# Patient Record
Sex: Female | Born: 1935 | Race: White | Hispanic: No | Smoking: Never smoker
Health system: Southern US, Community
[De-identification: ages and names within clinical notes are randomized; demographics above are authoritative.]

## PROBLEM LIST (undated history)

## (undated) DIAGNOSIS — K449 Diaphragmatic hernia without obstruction or gangrene: Secondary | ICD-10-CM

## (undated) DIAGNOSIS — F419 Anxiety disorder, unspecified: Secondary | ICD-10-CM

## (undated) DIAGNOSIS — M5137 Other intervertebral disc degeneration, lumbosacral region: Secondary | ICD-10-CM

## (undated) DIAGNOSIS — F32A Depression, unspecified: Secondary | ICD-10-CM

## (undated) DIAGNOSIS — E785 Hyperlipidemia, unspecified: Secondary | ICD-10-CM

## (undated) DIAGNOSIS — K219 Gastro-esophageal reflux disease without esophagitis: Secondary | ICD-10-CM

## (undated) DIAGNOSIS — G8929 Other chronic pain: Secondary | ICD-10-CM

## (undated) DIAGNOSIS — I2510 Atherosclerotic heart disease of native coronary artery without angina pectoris: Secondary | ICD-10-CM

## (undated) DIAGNOSIS — I10 Essential (primary) hypertension: Secondary | ICD-10-CM

## (undated) DIAGNOSIS — M4800 Spinal stenosis, site unspecified: Secondary | ICD-10-CM

## (undated) DIAGNOSIS — S42209A Unspecified fracture of upper end of unspecified humerus, initial encounter for closed fracture: Secondary | ICD-10-CM

## (undated) DIAGNOSIS — F329 Major depressive disorder, single episode, unspecified: Secondary | ICD-10-CM

## (undated) DIAGNOSIS — F0390 Unspecified dementia without behavioral disturbance: Secondary | ICD-10-CM

## (undated) HISTORY — PX: NASAL SINUS SURGERY: SHX719

## (undated) HISTORY — DX: Hyperlipidemia, unspecified: E78.5

## (undated) HISTORY — DX: Diaphragmatic hernia without obstruction or gangrene: K44.9

## (undated) HISTORY — PX: HEMORRHOID SURGERY: SHX153

## (undated) HISTORY — DX: Atherosclerotic heart disease of native coronary artery without angina pectoris: I25.10

## (undated) HISTORY — DX: Major depressive disorder, single episode, unspecified: F32.9

## (undated) HISTORY — PX: TOTAL ABDOMINAL HYSTERECTOMY: SHX209

## (undated) HISTORY — DX: Essential (primary) hypertension: I10

## (undated) HISTORY — PX: OTHER SURGICAL HISTORY: SHX169

## (undated) HISTORY — PX: BACK SURGERY: SHX140

## (undated) HISTORY — PX: TONSILECTOMY, ADENOIDECTOMY, BILATERAL MYRINGOTOMY AND TUBES: SHX2538

## (undated) HISTORY — DX: Depression, unspecified: F32.A

## (undated) HISTORY — PX: CHOLECYSTECTOMY: SHX55

## (undated) HISTORY — DX: Anxiety disorder, unspecified: F41.9

## (undated) HISTORY — DX: Gastro-esophageal reflux disease without esophagitis: K21.9

---

## 2001-10-23 ENCOUNTER — Inpatient Hospital Stay (HOSPITAL_COMMUNITY): Admission: EM | Admit: 2001-10-23 | Discharge: 2001-10-28 | Payer: Self-pay | Admitting: *Deleted

## 2001-10-24 ENCOUNTER — Encounter: Payer: Self-pay | Admitting: *Deleted

## 2001-10-24 ENCOUNTER — Encounter: Payer: Self-pay | Admitting: Gastroenterology

## 2002-08-12 ENCOUNTER — Encounter: Payer: Self-pay | Admitting: Orthopaedic Surgery

## 2002-08-12 ENCOUNTER — Ambulatory Visit (HOSPITAL_COMMUNITY): Admission: RE | Admit: 2002-08-12 | Discharge: 2002-08-12 | Payer: Self-pay | Admitting: Orthopaedic Surgery

## 2004-08-07 ENCOUNTER — Ambulatory Visit: Payer: Self-pay | Admitting: Cardiology

## 2005-03-04 ENCOUNTER — Ambulatory Visit: Payer: Self-pay | Admitting: Cardiology

## 2006-01-03 ENCOUNTER — Ambulatory Visit: Payer: Self-pay | Admitting: Cardiology

## 2006-01-10 ENCOUNTER — Ambulatory Visit: Payer: Self-pay | Admitting: Cardiology

## 2006-08-27 ENCOUNTER — Ambulatory Visit: Payer: Self-pay | Admitting: Cardiology

## 2006-11-27 ENCOUNTER — Ambulatory Visit: Payer: Self-pay | Admitting: Orthopedic Surgery

## 2007-03-25 ENCOUNTER — Ambulatory Visit: Payer: Self-pay | Admitting: Orthopedic Surgery

## 2007-03-26 ENCOUNTER — Ambulatory Visit: Payer: Self-pay | Admitting: Cardiology

## 2007-10-09 ENCOUNTER — Ambulatory Visit: Payer: Self-pay | Admitting: Cardiology

## 2008-03-21 ENCOUNTER — Ambulatory Visit: Payer: Self-pay | Admitting: Cardiology

## 2008-09-05 ENCOUNTER — Encounter: Payer: Self-pay | Admitting: Cardiology

## 2008-11-07 ENCOUNTER — Ambulatory Visit: Payer: Self-pay | Admitting: Cardiology

## 2008-11-16 ENCOUNTER — Encounter: Payer: Self-pay | Admitting: Cardiology

## 2008-11-16 ENCOUNTER — Ambulatory Visit: Payer: Self-pay | Admitting: Cardiology

## 2009-01-13 DIAGNOSIS — E785 Hyperlipidemia, unspecified: Secondary | ICD-10-CM

## 2009-05-03 ENCOUNTER — Ambulatory Visit: Payer: Self-pay | Admitting: Cardiology

## 2009-05-03 DIAGNOSIS — I10 Essential (primary) hypertension: Secondary | ICD-10-CM

## 2009-05-03 DIAGNOSIS — I2510 Atherosclerotic heart disease of native coronary artery without angina pectoris: Secondary | ICD-10-CM | POA: Insufficient documentation

## 2009-09-01 ENCOUNTER — Encounter: Payer: Self-pay | Admitting: Cardiology

## 2009-10-26 ENCOUNTER — Ambulatory Visit: Payer: Self-pay | Admitting: Cardiology

## 2009-12-20 ENCOUNTER — Ambulatory Visit: Payer: Self-pay | Admitting: Orthopedic Surgery

## 2009-12-20 DIAGNOSIS — M4120 Other idiopathic scoliosis, site unspecified: Secondary | ICD-10-CM | POA: Insufficient documentation

## 2009-12-20 DIAGNOSIS — M5137 Other intervertebral disc degeneration, lumbosacral region: Secondary | ICD-10-CM

## 2009-12-20 DIAGNOSIS — M4800 Spinal stenosis, site unspecified: Secondary | ICD-10-CM

## 2009-12-20 HISTORY — DX: Spinal stenosis, site unspecified: M48.00

## 2009-12-20 HISTORY — DX: Other intervertebral disc degeneration, lumbosacral region: M51.37

## 2009-12-21 ENCOUNTER — Encounter (INDEPENDENT_AMBULATORY_CARE_PROVIDER_SITE_OTHER): Payer: Self-pay | Admitting: *Deleted

## 2009-12-22 ENCOUNTER — Telehealth: Payer: Self-pay | Admitting: Orthopedic Surgery

## 2010-01-02 ENCOUNTER — Telehealth: Payer: Self-pay | Admitting: Orthopedic Surgery

## 2010-03-06 ENCOUNTER — Telehealth: Payer: Self-pay | Admitting: Orthopedic Surgery

## 2010-03-17 ENCOUNTER — Encounter: Payer: Self-pay | Admitting: Orthopedic Surgery

## 2010-03-17 ENCOUNTER — Emergency Department (HOSPITAL_COMMUNITY): Admission: EM | Admit: 2010-03-17 | Discharge: 2010-03-17 | Payer: Self-pay | Admitting: Emergency Medicine

## 2010-03-20 ENCOUNTER — Encounter: Payer: Self-pay | Admitting: Orthopedic Surgery

## 2010-03-26 ENCOUNTER — Ambulatory Visit: Payer: Self-pay | Admitting: Orthopedic Surgery

## 2010-03-26 DIAGNOSIS — S42209A Unspecified fracture of upper end of unspecified humerus, initial encounter for closed fracture: Secondary | ICD-10-CM | POA: Insufficient documentation

## 2010-03-26 HISTORY — DX: Unspecified fracture of upper end of unspecified humerus, initial encounter for closed fracture: S42.209A

## 2010-03-27 ENCOUNTER — Ambulatory Visit (HOSPITAL_COMMUNITY): Admission: RE | Admit: 2010-03-27 | Payer: Self-pay | Admitting: Neurosurgery

## 2010-03-28 ENCOUNTER — Encounter: Payer: Self-pay | Admitting: Orthopedic Surgery

## 2010-04-10 ENCOUNTER — Ambulatory Visit: Payer: Self-pay | Admitting: Orthopedic Surgery

## 2010-04-18 ENCOUNTER — Encounter
Admission: RE | Admit: 2010-04-18 | Discharge: 2010-05-17 | Payer: Self-pay | Source: Home / Self Care | Attending: Orthopedic Surgery | Admitting: Orthopedic Surgery

## 2010-04-25 ENCOUNTER — Telehealth: Payer: Self-pay | Admitting: Orthopedic Surgery

## 2010-04-26 ENCOUNTER — Encounter: Payer: Self-pay | Admitting: Orthopedic Surgery

## 2010-05-20 ENCOUNTER — Encounter
Admission: RE | Admit: 2010-05-20 | Discharge: 2010-06-19 | Payer: Self-pay | Source: Home / Self Care | Attending: Orthopedic Surgery | Admitting: Orthopedic Surgery

## 2010-05-24 ENCOUNTER — Ambulatory Visit
Admission: RE | Admit: 2010-05-24 | Discharge: 2010-05-24 | Payer: Self-pay | Source: Home / Self Care | Attending: Orthopedic Surgery | Admitting: Orthopedic Surgery

## 2010-05-24 ENCOUNTER — Encounter: Payer: Self-pay | Admitting: Orthopedic Surgery

## 2010-06-09 ENCOUNTER — Encounter: Payer: Self-pay | Admitting: Neurosurgery

## 2010-06-19 NOTE — Consult Note (Signed)
Summary: Consult note from Dr.Mark Channing Mutters  Consult note from Dr.Mark Channing Mutters   Imported By: Jacklynn Ganong 03/20/2010 11:43:09  _____________________________________________________________________  External Attachment:    Type:   Image     Comment:   External Document

## 2010-06-19 NOTE — Miscellaneous (Signed)
Summary: PT initial evaluation  PT initial evaluation   Imported By: Jacklynn Ganong 04/27/2010 08:58:22  _____________________________________________________________________  External Attachment:    Type:   Image     Comment:   External Document

## 2010-06-19 NOTE — Letter (Signed)
Summary: History form  History form   Imported By: Jacklynn Ganong 03/28/2010 11:46:42  _____________________________________________________________________  External Attachment:    Type:   Image     Comment:   External Document

## 2010-06-19 NOTE — Assessment & Plan Note (Signed)
Summary: ap er 110/29/11 fx left humerus/mcr/bcbs/bsf   Visit Type:  new problem Referring Provider:  [facility] ER Primary Provider:  Dr. Kathlee Nations  CC:  left humerus fracture.  History of Present Illness: I saw Kelsey Luna in the office today for an initial visit.  She is a 77 years old woman with the complaint of:  left humerus fracture.  DOI 03-17-10. She fell on her left side.  Xrays at [facility] on 03-17-10 show left humeral neck fracture.  She was given Hydrocodone 5 by the ER and they help.  The patient fell and fractured her LEFT humerus. The initial films were of the humerus and not of the shoulder, we did have to take a lateral x-ray to confirm the nondisplaced fracture.  She is in a shoulder immobilizer.  She does complain of pain over the LEFT proximal humerus.  Allergies: 1)  ! Sudafed  Review of Systems Constitutional:  Denies weight loss, weight gain, fever, chills, and fatigue. Cardiovascular:  Denies chest pain, palpitations, fainting, and murmurs. Respiratory:  Denies short of breath, wheezing, couch, tightness, pain on inspiration, and snoring . Gastrointestinal:  Complains of nausea; denies heartburn, vomiting, diarrhea, constipation, and blood in your stools. Genitourinary:  Denies frequency, urgency, difficulty urinating, painful urination, flank pain, and bleeding in urine. Neurologic:  Complains of unsteady gait; denies numbness, tingling, dizziness, tremors, and seizure. Musculoskeletal:  Denies joint pain, swelling, instability, stiffness, redness, heat, and muscle pain. Endocrine:  Denies excessive thirst, exessive urination, and heat or cold intolerance. Psychiatric:  Complains of depression; denies nervousness, anxiety, and hallucinations. Skin:  Denies changes in the skin, poor healing, rash, itching, and redness. HEENT:  Denies blurred or double vision, eye pain, redness, and watering. Immunology:  Denies seasonal allergies, sinus problems, and  allergic to bee stings. Hemoatologic:  Denies easy bleeding and brusing.  Physical Exam  Skin:  intact without lesions or rashes Cervical Nodes:  no significant adenopathy Psych:  alert and cooperative; normal mood and affect; normal attention span and concentration   Shoulder/Elbow Exam  General:    Well-developed, well-nourished, normal body habitus; no deformities, normal grooming.    Skin:    she does have bruising and ecchymosis in the proximal LEFT humerus, along the subcutaneous skin  Inspection:    and swelling noted at the proximal humerus as well  Palpation:    with tenderness at the proximal humerus  Vascular:    Radial, ulnar, brachial, and axillary pulses 2+ and symmetric; capillary refill less than 2 seconds; no evidence of ischemia, clubbing, or cyanosis.    Sensory:    Gross sensation intact in the upper extremities.    Motor:    Normal strength in the upper extremities.    Reflexes:    defer  Shoulder Exam:    Left:    Inspection:  Abnormal    Palpation:  Abnormal    Stability:  stable    Tenderness:  left proximal humerus    Impression & Recommendations:  Problem # 1:  FRACTURE, HUMERUS, PROXIMAL (ICD-812.00) Assessment New  Orders: Est. Patient Level IV (16109) Shoulder x-ray,  minimum 2 views (60454) Humeral Neck Fx (09811)  Patient Instructions: 1)  xrays left shoulder in 2 weeks  2)  continue the sling x 2 weeks  3)  hand exercises daily    Orders Added: 1)  Est. Patient Level IV [91478] 2)  Shoulder x-ray,  minimum 2 views [73030] 3)  Humeral Neck Fx [23600]

## 2010-06-19 NOTE — Assessment & Plan Note (Signed)
Summary: RE-CK/XR/LT PROXIMAL HUMERUS/2 WK/MEDICARE/BCBS/CAF   Visit Type:  Follow-up Referring Provider:  [facility] ER Primary Provider:  Dr. Kathlee Nations  CC:  left shoulder fracture.  History of Present Illness: I saw Kelsey Luna in the office today for a 2 week  followup visit.  She is a 77 years old woman   ZD:GUYQ proximal humerus fracture  Date of injury March 17, 2010  Treatment sling-and-swathe  Plan for today x-rays and possibly ordering therapy.  Medications hydrocodone 5 mg needs refill  Complains of mild discomfort and stiffness LEFT proximal humerus and shoulder  AP and lateral x-rays of the LEFT shoulder show proximal humerus fracture which meets the near criteria for nonoperative treatment  Alignment is acceptable  Impression healing LEFT proximal humerus fracture with no additional displacement  Recommended physical therapy wean from sling follow up 6 weeks    Allergies: 1)  ! Sudafed   Impression & Recommendations: refill hydrocodone 5 mg   Other Orders: Post-Op Check (03474) Shoulder x-ray,  minimum 2 views (25956)  Patient Instructions: 1)  start Phys Therapy outpatient  2)  wear sling until weaned off at Phys therapy  3)  f/u in 6 weeks    Orders Added: 1)  Post-Op Check [99024] 2)  Shoulder x-ray,  minimum 2 views [73030]

## 2010-06-19 NOTE — Medication Information (Signed)
Summary: Tax adviser   Imported By: Cammie Sickle 04/13/2010 13:56:18  _____________________________________________________________________  External Attachment:    Type:   Image     Comment:   External Document

## 2010-06-19 NOTE — Progress Notes (Signed)
Summary: Neurosurgeon appointment.  Phone Note Outgoing Call   Call placed by: Waldon Reining,  January 02, 2010 4:51 PM Call placed to: Patient Action Taken: Appt scheduled Summary of Call: I called to give the patient her appointment at [facility] with Dr. Channing Mutters on 03-06-10 at 1:00. Patient is aware of her appointment.

## 2010-06-19 NOTE — Letter (Signed)
Summary: History form  History form   Imported By: Jacklynn Ganong 12/20/2009 14:04:41  _____________________________________________________________________  External Attachment:    Type:   Image     Comment:   External Document

## 2010-06-19 NOTE — Assessment & Plan Note (Signed)
Summary: 6 MO F/U PER REMINDER-JM   Visit Type:  Follow-up Primary Provider:  Dr. Kathlee Nations   History of Present Illness: 77 year old woman presents for follow-up. She denies any problems with active angina or unusual breathlessness. She states that she had followup lab work done with Dr. Maryellen Pile a few months ago and that her cholesterol was "high." She remains off of statin medications as detailed in the prior note.  She has been having some "back trouble" but otherwise tries to stay active. She just recently cut part of her yard on a riding mower.  We discussed her blood pressure which is elevated again today. It sounds as if her sodium intake is too liberal. Sodium restriction was discussed.  She also states she has had some trouble with depression, and is now on Buproprion XL.  Preventive Screening-Counseling & Management  Alcohol-Tobacco     Smoking Status: never  Current Medications (verified): 1)  Alprazolam 1 Mg Tabs (Alprazolam) .... Take 1-2 Tablet By Mouth Once A Day At Bedtime 2)  Lisinopril 20 Mg Tabs (Lisinopril) .... Take 1 Tablet By Mouth Once A Day 3)  Caltrate 600+d 600-400 Mg-Unit Tabs (Calcium Carbonate-Vitamin D) .... Take 3 Tablet By Mouth Once A Day 4)  Metoprolol Tartrate 25 Mg Tabs (Metoprolol Tartrate) .... Take 1 Tablet By Mouth Two Times A Day 5)  Hydrochlorothiazide 25 Mg Tabs (Hydrochlorothiazide) .... Take 1 Tablet By Mouth Once A Day 6)  Omeprazole 20 Mg Cpdr (Omeprazole) .... Take 1 Tablet By Mouth Once A Day 7)  Multi Vitamin .... Take 1 Tablet By Mouth Once A Day 8)  Potassium .... Take 1 Tablet By Mouth Once A Day 9)  Aspirin 325 Mg Tabs (Aspirin) .... Take 1 Tablet By Mouth Once A Day 10)  Fish Oil 1200 Mg Caps (Omega-3 Fatty Acids) .... Take 3 Tablet By Mouth Once A Day 11)  Miralax  Powd (Polyethylene Glycol 3350) .... Take 1 Tablet By Mouth Once A Day 12)  Garlic Oil 1000 Mg Caps (Garlic) .... Take 3 Tablet By Mouth Once A Day 13)  Vitamin B-12  500 Mcg Tabs (Cyanocobalamin) .... Take 1 Tablet By Mouth Once A Day 14)  Tylenol Extra Strength 500 Mg Tabs (Acetaminophen) .... Take 1 Tablet By Mouth Once A Day As Needed 15)  Budeprion Xl 300 Mg Xr24h-Tab (Bupropion Hcl) .... Take 1 Tablet By Mouth Once A Day 16)  Buspirone Hcl 5 Mg Tabs (Buspirone Hcl) .... Take 1 Tablet By Mouth Three Times A Day  Allergies (verified): 1)  ! Sudafed  Comments:  Nurse/Medical Assistant: The patient's medication list and allergies were reviewed with the patient and were updated in the Medication and Allergy Lists.  Past History:  Past Medical History: Last updated: 05/03/2009 CAD - DES LAD 6/03, LVEF 67% Hyperlipidemia Hypertension G E R D Hiatal hernia  Social History: Last updated: 05/03/2009 Widowed  Tobacco Use - No Alcohol Use - no  Review of Systems  The patient denies anorexia, fever, chest pain, syncope, dyspnea on exertion, peripheral edema, prolonged cough, melena, and hematochezia.         Otherwise reviewed and negative except as outlined.  Vital Signs:  Patient profile:   77 year old female Height:      63 inches Weight:      141 pounds Pulse rate:   54 / minute BP sitting:   156 / 88  (left arm) Cuff size:   regular  Vitals Entered By: Isabelle Course  Anderson (October 26, 2009 12:46 PM)  Physical Exam  Additional Exam:  Normally nourished appearing woman in no acute distress. HEENT: Conjunctiva and lids normal, oropharynx clear. Neck: Supple, no carotid bruits, no thyromegaly. Lungs: Clear without labored breathing. Cardiac: Regular rate and rhythm, no S3 gallop. Abdomen: Soft, no hepatomegaly, bowel sounds present. Extremities: No pitting edema, distal pulses 2+. Skin: Warm and dry. Musculoskeletal: No kyphosis. Neuropsychiatric: Alert and oriented x3, affect appropriate.   Nuclear Study  Procedure date:  11/16/2008  Findings:      Lexiscan cardiolite without diagnostic ST changes, no chest pain, and LVEF 75%.   Small, fixed, apical defect consistent with soft tissue attenuation.  EKG  Procedure date:  10/26/2009  Findings:      Sinus bradycardia at 56 beats per minute with nonspecific T wave changes.  Impression & Recommendations:  Problem # 1:  CORONARY ATHEROSCLEROSIS NATIVE CORONARY ARTERY (ICD-414.01)  Symptomatically stable without active angina on present medical therapy. Electrocardiogram is also stable. Followup be arranged for 6 months.  Her updated medication list for this problem includes:    Lisinopril 20 Mg Tabs (Lisinopril) .Marland Kitchen... Take 1 tablet by mouth once a day    Metoprolol Tartrate 25 Mg Tabs (Metoprolol tartrate) .Marland Kitchen... Take 1 tablet by mouth two times a day    Aspirin 325 Mg Tabs (Aspirin) .Marland Kitchen... Take 1 tablet by mouth once a day  Orders: EKG w/ Interpretation (93000)  Problem # 2:  ESSENTIAL HYPERTENSION, BENIGN (ICD-401.1)  Blood pressure is not well controlled. I discussed this with her again, including sodium restriction. I suspect she will ultimately require further medication titration, potentially an increase in her lisinopril.  Her updated medication list for this problem includes:    Lisinopril 20 Mg Tabs (Lisinopril) .Marland Kitchen... Take 1 tablet by mouth once a day    Metoprolol Tartrate 25 Mg Tabs (Metoprolol tartrate) .Marland Kitchen... Take 1 tablet by mouth two times a day    Hydrochlorothiazide 25 Mg Tabs (Hydrochlorothiazide) .Marland Kitchen... Take 1 tablet by mouth once a day    Aspirin 325 Mg Tabs (Aspirin) .Marland Kitchen... Take 1 tablet by mouth once a day  Problem # 3:  HYPERLIPIDEMIA-MIXED (ICD-272.4)  Reported intolerance to Zocor and Crestor in the past, and preference to not take Vytorin related to cost. She is on no statin medication now. Will request most recent lipids from Dr. Maryellen Pile for review.  Patient Instructions: 1)  Your physician wants you to follow-up in: 6 months. You will receive a reminder letter in the mail one-two months in advance. If you don't receive a letter, please  call our office to schedule the follow-up appointment. 2)  Decrease salt intake. 3)  We will obtain labs from Dr. Jake Church office and notify you if any changes need to be made once these are reviewed.

## 2010-06-19 NOTE — Miscellaneous (Signed)
Summary: neurosurgeon referral  Clinical Lists Changes  Orders: Added new Referral order of Neurosurgeon Referral (Neurosurgeon) - Signed 

## 2010-06-19 NOTE — Progress Notes (Signed)
Summary: Neurosurgeon referral.  Phone Note Outgoing Call   Call placed by: Waldon Reining,  December 22, 2009 9:47 AM Call placed to: Specialist Action Taken: Information Sent Summary of Call: I faxed a referral for this patient to [facility] to be seen for her back, scoliosis and spinal stenosis.

## 2010-06-19 NOTE — Progress Notes (Signed)
Summary: took a fall yesterday  Phone Note Other Incoming   Summary of Call: Dana Allan called about her mother, Kelsey Luna (28-Jun-2026) [name] fell while in [place] yesterday.  Was taken to [facility] ER and they did x rays of shoulder, pelvis, and legs. The doctor told her he did not see any fractures and it looked like her shoulder is healing nicely. She is OK today except for being very sore in right hip and leg.  She is scheduled for therapy today, but rescheduled until Friday She is scheduled to see you 05/24/10, but wanted to know if you think she needs to come in sooner.  Please advise. Dana's # [phone]   Initial call taken by: Jacklynn Ganong,  April 25, 2010 9:10 AM  Follow-up for Phone Call        no I think appointment as scheduled is ok , if no new injury  Follow-up by: Fuller Canada MD,  April 25, 2010 9:11 AM  Additional Follow-up for Phone Call Additional follow up Details #1::        Advised of reply Additional Follow-up by: Jacklynn Ganong,  April 25, 2010 9:14 AM

## 2010-06-19 NOTE — Assessment & Plan Note (Signed)
Summary: LOW BACK/HIP PAIN NEEDS XR/BCBS/MEDICARE/BSF   Vital Signs:  Patient profile:   77 year old female Height:      63 inches Weight:      142 pounds Pulse rate:   80 / minute Resp:     16 per minute  Vitals Entered By: Fuller Canada MD (December 20, 2009 10:52 AM)  Visit Type:  new patient Referring Provider:  self Primary Provider:  Dr. Kathlee Nations  CC:  low back pain.  History of Present Illness: This is an 77 year old female who complains of lower back pain after 2 back surgeries 31 and 38 years ago somewhere in [place].  Since last fall she has noted the RIGHT hip is higher than the LEFT and she's had RIGHT hip and lower back pain which has been sharp,burning and constant.  She thinks it came on suddenly; she cannot find any relief; its worse when she stands or walks.  Her legs are weak; she does not complain of numbness.  She does complain of some catching in her back.  Her pain is 9/10.  Needs xrays today.  Meds: Bupropion, Omeprazole, Lisinopril, Buspar, Metoprolol, Simvastatin, HCTZ, Xanax.    Allergies: 1)  ! Sudafed  Past History:  Past Medical History: CAD - DES LAD 6/03, LVEF 67% Hyperlipidemia Hypertension G E R D Hiatal hernia anxiety depression  Past Surgical History: Back surgery x 2 Hemorrhoidectomy Total Abdominal Hysterectomy Tonsillectomy Sinus surgery cataracts  Family History: Father: died age 76 with CAD, cancer, diabetes mellitus Mother: died age 30 with diabetes Siblings: diabetes in sister FH of Cancer:  Family History of Diabetes Family History Coronary Heart Disease female < 46 Family History of Arthritis Hx, family, asthma  Social History: Widowed  retired Tobacco Use - No Alcohol Use - no no caffeine use 12th grade ed.  Review of Systems Constitutional:  Denies weight loss, weight gain, fever, chills, and fatigue. Cardiovascular:  Denies chest pain, palpitations, fainting, and murmurs. Respiratory:  Complains of  wheezing and couch; denies short of breath, tightness, pain on inspiration, and snoring . Gastrointestinal:  Denies heartburn, nausea, vomiting, diarrhea, constipation, and blood in your stools. Genitourinary:  Denies frequency, urgency, difficulty urinating, painful urination, flank pain, and bleeding in urine. Neurologic:  Complains of unsteady gait; denies numbness, tingling, dizziness, tremors, and seizure. Musculoskeletal:  Complains of stiffness; denies joint pain, swelling, instability, redness, heat, and muscle pain. Endocrine:  Denies excessive thirst, exessive urination, and heat or cold intolerance. Psychiatric:  Complains of depression; denies nervousness, anxiety, and hallucinations. Skin:  Denies changes in the skin, poor healing, rash, itching, and redness. HEENT:  Denies blurred or double vision, eye pain, redness, and watering. Immunology:  Complains of seasonal allergies; denies sinus problems and allergic to bee stings. Hemoatologic:  Denies easy bleeding and brusing.  Physical Exam  Additional Exam:  GEN: well developed, well nourished, normal grooming and hygiene, no deformity and endomorphicbody habitus.   CDV: Lower extremity, pulses are normal, no edema, no erythema. no tenderness  Lymph: normal lymph nodes   Skin: no rashes, skin lesions or open sores   NEURO: normal coordination, reflexes, sensation.   Psyche: awake, alert and oriented. Mood normal   Gait: she is standing with a superior RIGHT hip compared to the LEFT terms of the pelvic crest show scoliosis and her back has tenderness in the SI joints in the midline of the lumbar spine.  Straight leg raises seem normal.  Muscle strength and tone seemed normal and  lower extremities  Important her RIGHT hip and LEFT hip have normal range of motion abduction adduction flexion-extension no pain no deformity.  It measured leg lengths are equal she has a slight flexion contracture in the LEFT knee     Impression  & Recommendations:  Problem # 1:  DEGENERATIVE DISC DISEASE, LUMBOSACRAL SPINE (ICD-722.52) Assessment New  Orders: Est. Patient Level IV (16109) Pelvis x-ray, 1/2 views (72170) Lumbosacral Spine ,2/3 views (72100)  Problem # 2:  SCOLIOSIS, LUMBAR SPINE (ICD-737.30) Assessment: New  Orders: Est. Patient Level IV (60454) Pelvis x-ray, 1/2 views (72170) Lumbosacral Spine ,2/3 views (72100)  Problem # 3:  SPINAL STENOSIS (ICD-724.00) Assessment: New  x-rays lumbar spine show severe scoliosis degenerative disc disease multilevel disc space narrowing  Pelvic x-rays show mild arthritis RIGHT hip normal LEFT hip minor disease no surgery needed.  Recommend neurosurgical referral  Orders: Est. Patient Level IV (09811) Pelvis x-ray, 1/2 views (91478) Lumbosacral Spine ,2/3 views (72100)

## 2010-06-19 NOTE — Progress Notes (Signed)
Summary: patient picked up Xray films  Phone Note Call from Patient   Summary of Call: Patient and daughter came to pick up copy of Xray films from our office (were at front window.) Contact ph - Daughter cell ph #[phone], Dana Allan Initial call taken by: Cammie Sickle,  March 06, 2010 10:48 AM

## 2010-06-21 ENCOUNTER — Ambulatory Visit: Payer: Medicare Other | Attending: Orthopedic Surgery | Admitting: Physical Therapy

## 2010-06-21 DIAGNOSIS — M25519 Pain in unspecified shoulder: Secondary | ICD-10-CM | POA: Insufficient documentation

## 2010-06-21 DIAGNOSIS — R5381 Other malaise: Secondary | ICD-10-CM | POA: Insufficient documentation

## 2010-06-21 DIAGNOSIS — IMO0001 Reserved for inherently not codable concepts without codable children: Secondary | ICD-10-CM | POA: Insufficient documentation

## 2010-06-21 DIAGNOSIS — M25619 Stiffness of unspecified shoulder, not elsewhere classified: Secondary | ICD-10-CM | POA: Insufficient documentation

## 2010-06-21 NOTE — Assessment & Plan Note (Signed)
Summary: 6 WK RE-CK LT PROX HUMERUS/FOL'G PT/MEDICARE/BCBS/CAF   Visit Type:  Follow-up Referring Provider:  [facility] ER Primary Provider:  Dr. Kathlee Nations  CC:  left proximal humerus fracture.  History of Present Illness: I saw Kelsey Luna in the office today for a 6 week  followup visit.  She is a 77 years old woman with the complaint of:  left proximal humeral fracture.  UJ:[id number] proximal humerus fracture  Date of injury March 17, 2010  Treatment sling-and-swathe  Medications hydrocodone 5 mg   Physical therapy notes included in this evaluation been reviewed patient is making excellent progress and demonstrate 110 of forward elevation.    Allergies: 1)  ! Sudafed   Impression & Recommendations:  Problem # 1:  AFTERCARE HEALING TRAUMATIC FRACTURE UPPER ARM (ICD-V54.11) Assessment Comment Only  Orders: Post-Op Check (14782)  Problem # 2:  FRACTURE, HUMERUS, PROXIMAL (ICD-812.00) Assessment: Comment Only  Orders: Post-Op Check (95621)  Medications Added to Medication List This Visit: 1)  Lortab 5-500 Mg Tabs (Hydrocodone-acetaminophen) .Marland Kitchen.. 1 q 4 as needed pain  Patient Instructions: 1)  Please schedule a follow-up appointment in 1 month. Prescriptions: LORTAB 5-500 MG TABS (HYDROCODONE-ACETAMINOPHEN) 1 q 4 as needed pain  #60 x 1   Entered and Authorized by:   Fuller Canada MD   Signed by:   Fuller Canada MD on 05/24/2010   Method used:   Print then Give to Patient   RxID:   [id number]    Orders Added: 1)  Post-Op Check [41324]

## 2010-06-21 NOTE — Miscellaneous (Signed)
Summary: Progress Note LT Proximal Humerus fracture  Progress Note LT Proximal Humerus fracture   Imported By: Eugenio Hoes 05/24/2010 14:19:04  _____________________________________________________________________  External Attachment:    Type:   Image     Comment:   External Document

## 2010-06-26 ENCOUNTER — Ambulatory Visit: Payer: Medicare Other | Admitting: *Deleted

## 2010-06-28 ENCOUNTER — Ambulatory Visit: Payer: Medicare Other | Admitting: Physical Therapy

## 2010-07-02 ENCOUNTER — Ambulatory Visit: Payer: Medicare Other | Admitting: Physical Therapy

## 2010-07-03 ENCOUNTER — Ambulatory Visit (INDEPENDENT_AMBULATORY_CARE_PROVIDER_SITE_OTHER): Payer: Medicare Other | Admitting: Orthopedic Surgery

## 2010-07-03 ENCOUNTER — Encounter: Payer: Self-pay | Admitting: Orthopedic Surgery

## 2010-07-03 DIAGNOSIS — S42209A Unspecified fracture of upper end of unspecified humerus, initial encounter for closed fracture: Secondary | ICD-10-CM

## 2010-07-03 DIAGNOSIS — S42309D Unspecified fracture of shaft of humerus, unspecified arm, subsequent encounter for fracture with routine healing: Secondary | ICD-10-CM

## 2010-07-05 ENCOUNTER — Ambulatory Visit: Payer: Medicare Other | Admitting: *Deleted

## 2010-07-11 NOTE — Assessment & Plan Note (Signed)
Summary: 1 mth fol-up lt prox humerus/mcr/bcbs/wkj   Visit Type:  Follow-up Referring Provider:  [facility] Primary Provider:  Dr. Kathlee Nations  CC:  left humerus.  History of Present Illness: I saw Kelsey Luna in the office today for a 1 month followup visit.  She is a 77 years old woman with the complaint of:  left proximal humerus   EA:[id number] proximal humerus fracture  Date of injury March 17, 2010  Treatment sling-and-swathe  Medications hydrocodone 5 mg   Physical therapy notes included in this evaluation been reviewed patient is making excellent progress and demonstrate 110 of forward elevation.  Complaints: She has been falling alot because she has a catching in her back. She went to see Dr. Channing Mutters and he is sendind her for ESI injections. her shoulder is feeling better.  She has done well with therapy. She can placed and a cuff of her head without any difficulty.  We are releasing her. She'll follow up with Dr. Joylene Igo regarding her falling and spinal stenosis.  Allergies: 1)  ! Sudafed   Impression & Recommendations:  Problem # 1:  AFTERCARE HEALING TRAUMATIC FRACTURE UPPER ARM (ICD-V54.11) Assessment Improved  Orders: Est. Patient Level II (81191)  Patient Instructions: 1)  Please schedule a follow-up appointment as needed.   Orders Added: 1)  Est. Patient Level II [47829]

## 2010-07-12 ENCOUNTER — Other Ambulatory Visit: Payer: Self-pay | Admitting: Neurosurgery

## 2010-07-12 DIAGNOSIS — M47816 Spondylosis without myelopathy or radiculopathy, lumbar region: Secondary | ICD-10-CM

## 2010-07-16 ENCOUNTER — Ambulatory Visit
Admission: RE | Admit: 2010-07-16 | Discharge: 2010-07-16 | Disposition: A | Discharge status: Home / Self Care | Payer: Medicare Other | Source: Ambulatory Visit | Attending: Neurosurgery | Admitting: Neurosurgery

## 2010-07-16 DIAGNOSIS — M47816 Spondylosis without myelopathy or radiculopathy, lumbar region: Secondary | ICD-10-CM

## 2010-08-01 ENCOUNTER — Encounter: Payer: Self-pay | Admitting: *Deleted

## 2010-08-01 LAB — DIFFERENTIAL
Basophils Absolute: 0 10*3/uL (ref 0.0–0.1)
Basophils Relative: 0 % (ref 0–1)
Eosinophils Absolute: 0.1 10*3/uL (ref 0.0–0.7)
Eosinophils Relative: 1 % (ref 0–5)
Lymphocytes Relative: 22 % (ref 12–46)
Lymphs Abs: 2.1 10*3/uL (ref 0.7–4.0)
Monocytes Absolute: 0.5 10*3/uL (ref 0.1–1.0)
Monocytes Relative: 6 % (ref 3–12)
Neutro Abs: 6.8 10*3/uL (ref 1.7–7.7)
Neutrophils Relative %: 72 % (ref 43–77)

## 2010-08-01 LAB — CBC
HCT: 38.7 % (ref 36.0–46.0)
Hemoglobin: 13.3 g/dL (ref 12.0–15.0)
MCH: 31.6 pg (ref 26.0–34.0)
MCHC: 34.4 g/dL (ref 30.0–36.0)
MCV: 91.9 fL (ref 78.0–100.0)
Platelets: 342 10*3/uL (ref 150–400)
RBC: 4.21 MIL/uL (ref 3.87–5.11)
RDW: 12.8 % (ref 11.5–15.5)
WBC: 9.4 10*3/uL (ref 4.0–10.5)

## 2010-08-01 LAB — BASIC METABOLIC PANEL
BUN: 11 mg/dL (ref 6–23)
CO2: 24 mEq/L (ref 19–32)
Calcium: 10 mg/dL (ref 8.4–10.5)
Chloride: 94 mEq/L — ABNORMAL LOW (ref 96–112)
Creatinine, Ser: 0.59 mg/dL (ref 0.4–1.2)
GFR calc Af Amer: >60 mL/min (ref >60)
GFR calc non Af Amer: >60 mL/min (ref >60)
Glucose, Bld: 127 mg/dL — ABNORMAL HIGH (ref 70–99)
Potassium: 3.5 mEq/L (ref 3.5–5.1)
Sodium: 127 mEq/L — ABNORMAL LOW (ref 135–145)

## 2010-08-28 ENCOUNTER — Encounter: Payer: Self-pay | Admitting: Cardiology

## 2010-08-28 ENCOUNTER — Ambulatory Visit (INDEPENDENT_AMBULATORY_CARE_PROVIDER_SITE_OTHER): Payer: Medicare Other | Admitting: Cardiology

## 2010-08-28 VITALS — BP 135/78 | HR 55 | Ht 63.0 in | Wt 147.1 lb

## 2010-08-28 DIAGNOSIS — I2510 Atherosclerotic heart disease of native coronary artery without angina pectoris: Secondary | ICD-10-CM

## 2010-08-28 DIAGNOSIS — E785 Hyperlipidemia, unspecified: Secondary | ICD-10-CM

## 2010-08-28 DIAGNOSIS — I10 Essential (primary) hypertension: Secondary | ICD-10-CM

## 2010-08-28 NOTE — Assessment & Plan Note (Signed)
No medication changes made today. Followup with Dr. Maryellen Pile.

## 2010-08-28 NOTE — Assessment & Plan Note (Signed)
Symptomatically stable without active angina on medical therapy, fortunately in the setting of orthopedic problems over the last year. As best I can tell no surgery is planned. Most recent ischemic workup was in December 2010, outlined above. No changes made today. Encouraged reasonable activity with precautions, use of walker. Followup scheduled for 6 months.

## 2010-08-28 NOTE — Progress Notes (Signed)
Clinical Summary Kelsey Luna is a 77 y.o.female presenting for followup. She was seen in June 2011, Kelsey Luna visit related to a fall with arm fracture. She has been recuperating fairly well, also has associated spine disease followed by Kelsey Luna, using a walker and status post epidural injections. No surgery is planned based on her description.  Fortunately, she reports no syncope, no exertional chest pain. She reports compliance with her medications. She is due to see Kelsey Luna soon with followup lab work.   Allergies  Allergen Reactions  . Pseudoephedrine Other (See Comments)    Pt states she lost her memory for a day when she took Sudafed.     Current outpatient prescriptions:ALPRAZolam (XANAX) 1 MG tablet, Take 1 mg by mouth at bedtime. Take 1-2 tablets by mouth once a day at bedtime , Disp: , Rfl: ;  aspirin 325 MG tablet, Take 325 mg by mouth daily.  , Disp: , Rfl: ;  buPROPion (BUDEPRION XL) 300 MG 24 hr tablet, Take 300 mg by mouth daily.  , Disp: , Rfl: ;  BUSPIRONE HCL PO, Take 5 mg by mouth 3 (three) times daily. Take 1 tablet by mouth three times daily , Disp: , Rfl:  Calcium Carbonate-Vitamin D (CALTRATE 600+D) 600-400 MG-UNIT per tablet, Take 3 tablets by mouth daily. Take 3 tablets by mouth once a day , Disp: , Rfl: ;  Garlic Oil 1000 MG CAPS, Take 1,000 mg by mouth daily. Take 3 tablets by mouth daily   , Disp: , Rfl: ;  hydrochlorothiazide 25 MG tablet, Take 25 mg by mouth daily. Take one tablet by mouth once daily , Disp: , Rfl:  HYDROcodone-acetaminophen (LORTAB 5) 5-500 MG per tablet, Take 1 tablet by mouth every 4 (four) hours as needed. 1 q 4 as needed pain , Disp: , Rfl: ;  lisinopril (PRINIVIL,ZESTRIL) 20 MG tablet, Take 20 mg by mouth daily. Take one tablet by mouth once daily , Disp: , Rfl: ;  METOPROLOL TARTRATE PO, Take 25 mg by mouth 2 (two) times daily. Take 1 tablet by mouth two times a day   , Disp: , Rfl:  Multiple Vitamin (MULTIVITAMIN PO), Take by mouth daily.  ,  Disp: , Rfl: ;  Omega-3 Fatty Acids (FISH OIL) 1200 MG CAPS, Take by mouth daily. Take 3 tablets by mouth once a day , Disp: , Rfl: ;  omeprazole (PRILOSEC) 20 MG capsule, Take 20 mg by mouth daily.  , Disp: , Rfl: ;  polyethylene glycol (MIRALAX) powder, Take by mouth daily.  , Disp: , Rfl: ;  Potassium 99 MG TABS, Take 1 tablet by mouth daily.  , Disp: , Rfl:  acetaminophen (TYLENOL) 500 MG tablet, Take 500 mg by mouth daily. Take 1 tablet by mouth once a da as needed , Disp: , Rfl: ;  Cyanocobalamin (VITAMIN B-12 PO), Take 500 mcg by mouth daily.  , Disp: , Rfl:   Past Medical History  Diagnosis Date  . CAD (coronary artery disease)     DES LAD 6/03, LVEF 67%  . Hyperlipidemia   . Hypertension   . GERD (gastroesophageal reflux disease)   . Hiatal hernia   . Anxiety   . Depression     Social History Kelsey Luna reports that she has never smoked. She has never used smokeless tobacco. Kelsey Luna reports that she does not drink alcohol.  Review of Systems As outlined above, otherwise reviewed and negative.  Physical Examination Filed Vitals:   08/28/10  1513  BP: 135/78  Pulse: 55  Normally nourished appearing woman in no acute distress. HEENT: Conjunctiva and lids normal, oropharynx clear. Neck: Supple, no carotid bruits, no thyromegaly. Lungs: Clear without labored breathing. Cardiac: Regular rate and rhythm, no S3 gallop. Abdomen: Soft, no hepatomegaly, bowel sounds present. Extremities: No pitting edema, distal pulses 2+. Skin: Warm and dry. Musculoskeletal: No kyphosis. Neuropsychiatric: Alert and oriented x3, affect appropriate.   Studies Lexiscan Cardiolite 11/16/2008: Lexiscan cardiolite without diagnostic ST changes, no chest pain, and LVEF 75%.  Small, fixed, apical defect consistent with soft tissue attenuation.  Problem List and Plan

## 2010-08-28 NOTE — Assessment & Plan Note (Signed)
Pending followup lab work Dr. Maryellen Pile. Would aim for LDL close to 70 if possible.

## 2010-08-28 NOTE — Patient Instructions (Signed)
Your physician wants you to follow-up in: 6 months. You will receive a reminder letter in the mail one-two months in advance. If you don't receive a letter, please call our office to schedule the follow-up appointment. Your physician recommends that you continue on your current medications as directed. Please refer to the Current Medication list given to you today. 

## 2010-10-02 NOTE — Assessment & Plan Note (Signed)
[facility]                          [facility] NOTE   NAME:Kelsey Luna, Kelsey Luna                      MRN:          [id number]  DATE:10/09/2007                            DOB:          22-Aug-1926    CARDIOLOGIST:  Dr. Simona Huh.   PRIMARY CARE PHYSICIAN:  Dr. Kathlee Nations.   REASON FOR VISIT:  26-month followup.   HISTORY OF PRESENT ILLNESS:  [name] is a 77 year old female patient  with a history of CAD status post CYPHER stenting to the proximal LAD in  June 2003.  She was last in the office in November 2008.  Her last  stress test was August 2007 that revealed no ischemia.  Her ejection  fraction was preserved at 67%.   Since last being seen, the patient has been doing well.  She has been  trying to walk about 30 minutes per day.  She denies any chest heaviness  or tightness with exertion.  She denies any significant shortness of  breath with exertion.  She denies any syncope, near-syncope.  Denies any  orthopnea, PND or pedal edema.   CURRENT MEDICATIONS:  1. Omeprazole 20 mg daily.  2. Metoprolol 25 mg b.i.d.  3. Lisinopril 20 mg daily.  4. Hydrochlorothiazide 25 mg daily.  5. Enteric-coated aspirin 325 mg daily.  6. Diclofenac 50 mg 3 times a day.  7. Vytorin 20 mg daily.  8. Xanax 1 mg daily.  9. Multivitamin daily.  10.Calcium b.i.d.  11.Potassium 99 mg daily.  12.Fish oil 1200 mg b.i.d.  13.Vitamin B12 250 mg daily.  14.Darvocet p.r.n.   PHYSICAL EXAM:  She is well-developed, well-nourished female in no acute  distress.  Blood pressure is 133/60, pulse 53, weight 252.2 pounds.  HEENT is normal without JVD.  CARDIOVASCULAR:  S1, S2.  Regular rate and rhythm without murmur.  LUNGS:  Clear to auscultation bilaterally.  ABDOMEN:  Soft, nontender.  EXTREMITIES:  No edema.  NEUROLOGIC:  She is alert and oriented x3.  Cranial nerves 2-12 grossly  intact.  VASCULAR:  Without carotid bruits bilaterally.  Dorsalis pedis  and  presents pulses 2+ bilaterally.  No femoral bruits noted.   IMPRESSION:  1. Coronary disease status post CYPHER drug-eluting stent placement to      the LAD in 2003.      a.     Nonischemic Cardiolite 2007.  2. Preserved left ventricular function.  3. Hypertension.  4. Hyperlipidemia.  5. Gastroesophageal reflux disease.  6. Osteoarthritis.   PLAN:  1. The patient presents for followup.  Overall, she is doing well.      She denies any symptoms of angina.  No medication changes made      today.  No further testing is required at this point.  2. She will follow up in 6 months with Dr. Diona Browner or sooner p.r.n.      Tereso Newcomer, PA-C  Electronically Signed      [name], MD  Electronically Signed   SW/MedQ  DD: 10/09/2007  DT: 10/09/2007  Job #: [id number]   cc:   Renae Fickle  Maryellen Pile

## 2010-10-02 NOTE — Assessment & Plan Note (Signed)
[facility]                          [facility] NOTE   NAME:Kelsey Luna, Kelsey Luna                      MRN:          [id number]  DATE:11/07/2008                            DOB:          1927-03-12    PRIMARY CARE PHYSICIAN:  Dr. Kathlee Nations.   REASON FOR VISIT:  Routine followup.   HISTORY OF PRESENT ILLNESS:  Kelsey Luna was seen back in November 2009.  I was able to review lipids drawn by Dr. Maryellen Pile back in April, and LDL  was optimally controlled at 69.  Liver function tests were also normal.  Kelsey Luna states that over the last few months she has been experiencing  intermittent epigastric and chest discomfort, somewhat sporadic, and  occasionally worse after meals.  On the other hand, she mentions an  episode that occurred when she was walking a lot during a day while  shopping.  Her last ischemic evaluation was via an adenosine Cardiolite  in August 2007.  This study revealed a left ventricular ejection  fraction of 67% with a small periapical defect that was fixed and  overall low risk for significant ischemia.  Her followup  electrocardiogram today shows sinus bradycardia at 49 beats per minute  with diffuse nonspecific ST-T wave changes, not progressive when  compared to the prior tracing in November.  She also has decreased R-  wave progression which is also old.   ALLERGIES:  SUDAFED.   MEDICATIONS:  1. Omeprazole 20 mg p.o. daily.  2. Lopressor 25 mg p.o. b.i.d.  3. Lisinopril 20 mg p.o. daily.  4. Hydrochlorothiazide 25 mg p.o. daily.  5. Enteric-coated aspirin 325 mg p.o. daily.  6. Diclofenac 50 mg p.o. b.i.d. to t.i.d.  7. Vytorin 10/20 mg p.o. daily.  8. Xanax 1 mg p.o. at bedtime.  9. Multivitamin daily.  10.Calcium with vitamin D supplements.  11.Over-the-counter potassium supplements.  12.Omega-3 fatty acid 1200 mg p.o. b.i.d.  13.Vitamin B12 250 mg p.o. daily.  14.Claritin 10 mg p.o. daily.  15.Garlic tablets  daily.  16.Darvocet p.r.n.   REVIEW OF SYSTEMS:  As outlined above.  No palpitations, dizziness, or  syncope.  She reports having some anxiety and nerve problems.  Otherwise, reviewed and negative.   PHYSICAL EXAMINATION:  VITAL SIGNS:  Blood pressure is 127/74, heart  rate is 51, weight is 143 pounds.  GENERAL:  The patient is comfortable and in no acute distress.  HEENT:  Conjunctivae and lids are normal.  Oropharynx is clear.  NECK:  Supple.  No elevated jugular venous pressure.  No loud bruits.  No thyromegaly is noted.  LUNGS:  Clear without labored breathing at rest.  CARDIAC:  Regular rate and rhythm.  No S3 gallop.  PMI is nondisplaced.  ABDOMEN:  Soft, nontender.  EXTREMITIES:  No significant pitting edema.  Distal pulses are 2+.  SKIN:  Warm and dry.  MUSCULOSKELETAL:  No kyphosis noted.  NEUROPSYCHIATRIC:  The patient is alert and oriented x3.  Affect is  appropriate.   IMPRESSION AND RECOMMENDATION:  1. Recurrent chest/epigastric discomfort with some typical and some  atypical features over the last few months.  Electrocardiogram      shows no acute changes.  We will plan to continue medical therapy      and repeat ischemic evaluation via a Lexiscan Cardiolite.  If this      is reassuring, we can plan to see her back over the next 6 months,      otherwise sooner for further assessment.  Kelsey Luna did voice some      concerns as to whether this might be her gallbladder.  This      possibility can be reviewed further by Dr. Maryellen Pile if her cardiac      testing is reassuring.  2. Hyperlipidemia, well controlled LDL on Vytorin.     [name], MD  Electronically Signed    SGM/MedQ  DD: 11/07/2008  DT: 11/07/2008  Job #: [id number]   cc:   Kathlee Nations

## 2010-10-02 NOTE — Assessment & Plan Note (Signed)
[facility]                          [facility] NOTE   NAME:Kelsey Luna, Kelsey Luna                      MRN:          [id number]  DATE:03/21/2008                            DOB:          [birth year]    PRIMARY CARE PHYSICIAN:  Kelsey Luna.   REASON FOR VISIT:  Routine followup.   HISTORY OF PRESENT ILLNESS:  Kelsey Luna returns for a 47-month visit.  She  is not reporting any problems with angina or limiting breathlessness  beyond NYHA class II.  Her electrocardiogram is stable showing sinus  rhythm with low voltage and no acute ST-T wave changes.  She states that  she had recent lipids done that were not optimal, although does admit  that her diet has been less well controlled over the last year.  She has  however been compliant with her medications including Vytorin, has not  had to use any sublingual nitroglycerin.   ALLERGIES:  SUDAFED.   PRESENT MEDICATIONS:  1. Omeprazole 20 mg p.o. daily.  2. Lopressor 25 mg p.o. b.i.d.  3. Lisinopril 20 mg p.o. daily.  4. Hydrochlorothiazide 25 mg p.o. daily.  5. Enteric-coated aspirin 325 mg p.o. daily.  6. Diclofenac 50 mg p.o. t.i.d.  7. Vytorin 10/20 mg p.o. daily.  8. Xanax 1 mg p.o. nightly.  9. Multivitamin daily.  10.Calcium with vitamin D.  11.Potassium supplements.  12.Omega-3 supplements.  13.Vitamin B12.  14.Claritin.  15.Garlic tablets.   REVIEW OF SYSTEMS:  As described in history of present illness.  She has  some mild left ankle edema that is dependent.  Also, occasional  arthritic pains and occasional reflux symptoms.  Otherwise, negative.   PHYSICAL EXAMINATION:  VITAL SIGNS:  Blood pressure is 133/71, heart  rate is 58, and weight is 150 pounds.  GENERAL:  The patient is comfortable, in no acute distress.  NECK:  No elevated jugular venous pressure.  No loud bruits.  No  thyromegaly.  LUNGS:  Clear on breathing at rest.  CARDIAC:  Regular rate and rhythm.  No pathologic  murmur or S3 gallop.  EXTREMITIES:  No pitting edema.   IMPRESSION AND RECOMMENDATIONS:  1. Cardiovascular disease, status post drug-eluting stent placement to      the left anterior descending in 2003 with nonischemic Cardiolite in      2007 and overall normal ventricular systolic function.  Kelsey Luna      is symptomatically stable without angina and we will plan to      continue medical therapy with symptom follow up in 6 months.  2. Hyperlipidemia, on Vytorin.  Would aim for LDL control around 70.      She does admit to some dietary indiscretions over the last several      months and plans to tighten up on this.  Otherwise, could always      advance Vytorin dosing.     [name], MD  Electronically Signed    SGM/MedQ  DD: 03/21/2008  DT: 03/22/2008  Job #: [id number]   cc:   Kelsey Luna

## 2010-10-02 NOTE — Assessment & Plan Note (Signed)
[facility]                          [facility] NOTE   NAME:Kelsey Luna, Kelsey Luna                      MRN:          [id number]  DATE:03/26/2007                            DOB:          1927/02/13    PRIMARY CARE PHYSICIAN:  Dr. Kathlee Nations.   REASON FOR VISIT:  Cardiac followup.   HISTORY OF PRESENT ILLNESS:  Kelsey Luna comes in for a 26-month visit.  She is not reporting any significant angina or breathlessness.  She  states that she stepped through a rotten board on her deck and had some  injuries to her right leg and shoulder, although these seemed to be  improving, and she did not break any bones.  Her electrocardiogram today  shows sinus bradycardia at 51 beats per minute with decreased lateral  forces.  Of note, she had a Cardiolite last August demonstrating an  ejection fraction of 67% with small periapical non-reversible defect and  no large areas of ischemia.  Her medications are outlined below.  Her  medications are outlined below.  She is not reporting any problems with  orthopnea, palpitations, or lower extremity edema.   ALLERGIES:  SUDAFED.   PRESENT MEDICATIONS:  1. Omeprazole 20 mg p.o. daily.  2. Metoprolol 25 mg p.o. b.i.d.  3. Lisinopril 20 mg p.o. daily.  4. Hydrochlorothiazide 25 mg p.o. daily.  5. Enteric-coated aspirin 325 mg p.o. daily.  6. Diclofenac 50 mg p.o. t.i.d.  7. Vytorin 10/20 mg p.o. daily.  8. Xanax 1 mg p.o. daily.  9. Potassium supplements.  10.Vitamin D supplements.  11.Omega 3 supplements 1200 mg p.o. b.i.d.   REVIEW OF SYSTEMS:  As described in the history of present illness.  Otherwise, negative.   EXAMINATION:  Blood pressure 156/81, heart rate is 54, weight is 153  pounds.  The patient is comfortable and in no acute distress.  Examination of the neck reveals no elevated jugular venous pressure.  No  loud bruits.  No thyromegaly is noted.  LUNGS:  Clear without labored breathing at rest.  CARDIAC:  Exam reveals a regular rate and rhythm with no loud murmur,  rub, or gallop.  EXTREMITIES:  No significant pitting edema.   IMPRESSION/RECOMMENDATIONS:  1. Stable coronary artery disease status post previous drug-eluting      stent placement to the left anterior descending in 2003.  Plan will      be to continue medical therapy and symptom observation in 6 months.      She will let me know if things change in the interim.  2. Hypertension and hyperlipidemia, followed by Dr. Maryellen Pile.  Goal LDL      should be 70 or less.  Blood pressure is elevated today.  I have      asked the patient to keep an eye on this and follow-up with Dr.      Maryellen Pile.     [name], MD  Electronically Signed    SGM/MedQ  DD: 03/26/2007  DT: 03/26/2007  Job #: [id number]   cc:   Kathlee Nations

## 2010-10-05 NOTE — Procedures (Signed)
[place]  Patient:    Kelsey, Luna Visit Number: [id number] MRN: [id number]          Service Type: MED Location: MICU 2113 [id number] Attending Physician:  Veneda Melter Dictated by:   Everardo Beals. Juanda Chance, M.D. [facility] Proc. Date: 10/27/01 Admit Date:  10/23/2001 Discharge Date: 10/28/2001   CC:         Jeralyn Ruths, M.D., [place], [place]  [name], M.D. [facility]  Caralee Ates, M.D.   Procedure Report  CLINICAL HISTORY:  The patient is 77 years old and was seen in [facility] Emergency Room with chest and upper abdominal pain.  She was felt to possibly have ischemic bowel and was transferred to Dr. Christean Leaf service but on arrival, she was felt to probably have unstable angina.  She underwent catheterization today by Dr. Antoine Poche.  He found what appeared to be a ruptured plaque in the proximal LAD just after a large diagonal branch.  DESCRIPTION OF PROCEDURE:  The procedure was performed via the right femoral artery using an arterial sheath and a #6 JL3.5 Medtronic guiding catheter. The patient was Angiomax bolus and received an extra bolus because the initial ACT was 222 giving a final ACT of 281.  We crossed the lesion in the proximal LAD with the luge wire without difficulty.  We direct-stented with a 2.5 x 13-mm Cypher stent, deploying this with one inflation of 14 atmospheres for 40 seconds.  We then postdilated with a 3.0 x 8-mm Quantum Maverick, performing 2 inflations of 14 and 12 atmospheres for 25 and 33 seconds, avoiding the proximal edge.  A repeat diagnostic study was then performed through the guiding catheter.  We attempted to position the proximal edge of the stent right at a septal perforator distal to the diagonal branch.  It migrated slightly distal to this position but the vessel caliber was about the same in that area with some mild segmental disease and we got a good result.  RESULTS:  Initially the stenosis located just after the  first large diagonal branch and septal perforator was estimated at 80%.  Following stenting, this improved to less than 10%.  There was no dissection seen.  CONCLUSIONS:  Successful stenting of the proximal left anterior descending artery stenosis with improvement of percent diameter narrowing from 80% to less than 10% with a Cypher stent. Dictated by:   Everardo Beals Juanda Chance, M.D. [facility] Attending Physician:  Veneda Melter DD:  10/27/01 TD:  10/28/01 Job: [id number]

## 2010-10-05 NOTE — Discharge Summary (Signed)
[place]. [facility]  Patient:    Kelsey Luna, SWALLOW Visit Number: [id number] MRN: [id number]          Service Type: MED Location: MICU 2113 01 Attending Physician:  Caralee Ates Dictated by:   Pennelope Bracken, N.P. Admit Date:  10/23/2001 Disc. Date: 10/28/01   CC:         Caralee Ates, M.D.  Cornell Barman. Maryellen Pile, M.D., [place], [place]   Discharge Summary  DATE OF BIRTH:  Jun 29, 2026  REASON FOR ADMISSION:  Abdominal and chest pain.  DISCHARGE DIAGNOSES: 1. Coronary artery disease.  Findings at left heart catheterization revealing    left anterior descending with 80% proximal disease, circumflex artery    normal, right coronary artery dominant and normal, and left ventricular    ejection fraction 55%.  Treated this admission with a percutaneous    intervention of the left anterior descending with placement of Sypher drug    eluting stent to the left anterior descending with reduction and stenosis    there to less than 10%. 2. Status post severe acute abdominal pain with a history of chronic    constipation and gastroesophageal reflux disease.  Improved at discharge    with MiraLax and Protonix. 3. Chronic left hip pain treated with injectable and p.o. steroids. 4. Hyperlipidemia.  Elevated triglycerides and very-low-density lipoprotein.  HISTORY OF PRESENT ILLNESS:  This delightful 77 year old lady with history as outlined above, presented to [facility] on the day of admission with complaints of severe abdominal pain with radiation to the back, chest, neck, and left arm.  Her CK-MB isoenzymes were slightly elevated, so she was transferred to [facility] for further evaluation.  HOSPITAL COURSE:  The patient was continued on aspirin, heparin, and IV nitroglycerin as begun at [facility].  She was placed on telemetry and EKGs revealed sinus arrhythmia at a rate of 60 without ischemic changes.  CV surgery was asked to consult to rule  out aortic dissection and this was done. Gastroenterology was consulted and they felt that there was no clear etiology to the abdominal portion of the pain, so they treated the patient with proton pump inhibition and laxative therapy with good results.  A barium swallow was conducted which revealed a small sliding hiatal hernia and intermittent tertiary peristaltic activity throughout the esophagus.  Cardiac enzymes continued to be cycled and troponins were negative.  The patient was taken for cardiac catheterization to define the anatomy and to rule out ischemia as a contributing cause to her symptoms.  Findings were of severe single vessel disease as noted above and this was treated with placement of a drug eluting stent.  This was placed by Bruce R. Juanda Chance, M.D. The patient tolerated the procedure well and recovered uneventfully.  Bruce Elvera Lennox Juanda Chance, M.D., saw the patient on the day of discharge and found her to in stable condition.  PHYSICAL EXAMINATION:  On the day of discharge, the patient offered no complaints of chest pain, dyspnea, palpitations, or abdominal pain.  VITAL SIGNS:  Blood pressure 128/60, pulse 78, respirations 18, pulse oximetry 96% on room air, temperature afebrile.  GENERAL APPEARANCE:  No acute distress.  NECK:  No JVD.  LUNGS:  Clear to auscultation bilaterally.  CARDIOVASCULAR:  Regular rate and rhythm.  EXTREMITIES:  Without clubbing, cyanosis, or edema.  Right femoral arterial site without signs of hematoma or bruit.  LABORATORY DATA:  Discharge hemogram with WBC 8.5, hemoglobin 12.5, hematocrit 35.7, and platelets 303.  Discharge chemistries with sodium 140, potassium 3.6, BUN 9, creatinine 0.6, and glucose 95.  CK #1 84, MB 6.2, and troponin I 0.01.  CK #2 62, MB 4.6, and troponin 0.02.  Lipid profile with total cholesterol 192, triglycerides 215, HDL 40, LDL 109, and VLDL 43.  Liver panel with AST 17, ALT 17, total bilirubin 0.5, amylase 52, and  lipase 20.  The abdominal ultrasound was within normal limits.  The barium swallow with esophagogram was normal as above.  The chest x-ray showed right basilar and lingular scarring with a tiny right pleural effusion.  A two-view abdomen showed no dilated bowel to suggest obstruction.  The 12-lead EKG showed sinus arrhythmia with nonspecific ST-T wave abnormalities.  No significant change since previous tracing.  DISPOSITION:  The patient is discharged to home in the care of her very supportive daughter on the following medications.  DISCHARGE MEDICATIONS: 1. Protonix 40 mg one b.i.d. 2. Enteric-coated aspirin 325 mg one q.d. 3. Plavix 75 mg one q.d. x 6 months. 4. Prednisone taper as before. 5. Other medications and supplements as before. 6. MiraLax 17 g in 8 ounces of water once b.i.d. for several months. 7. Zocor 20 mg one q.d. q.h.s.  ACTIVITY:  The patient is not to drive, take tub baths or do heavy lifting for three days.  She will call the office for groin changes or if it becomes hard or painful.  She will exercise by walking 30 minutes a day.  DIET:  Low fat, low cholesterol.  FOLLOW-UP:  She will follow up on her constipation by primary care in a months time.  She will see [name], M.D., at the [place], [place], [place] office on Wednesday, November 11, 2001, at 10:15 a.m.  She knows to call in the interim with any problems, concerns, or change or increase in symptoms. Dictated by:   Pennelope Bracken, N.P. Attending Physician:  Caralee Ates. DD:  10/28/01 TD:  10/28/01 Job: [id number]

## 2010-10-05 NOTE — Consult Note (Signed)
[place]. [facility]  Patient:    Kelsey Luna, Kelsey Luna Visit Number: [id number] MRN: [id number]          Service Type: MED Location: MICU 2113 01 Attending Physician:  Caralee Ates Dictated by:   Florencia Reasons, M.D. Proc. Date: 10/24/01 Admit Date:  10/23/2001   CC:         Caralee Ates, M.D.  Noralyn Pick. Eden Emms, M.D. [facility]   Consultation Report  Dr. Elyn Peers asked me to see this 77 year old female because of severe abdominal pain.  HISTORY:  Mrs. Donnelly has a basically benign past GI history apart from a "cancerous polyp" removed in [place], [place] about a year ago and with a follow-up colonoscopy being done last October.  Her only other GI problems are significant constipation of about six months duration to the point where she even needs digital facilitation to get the stool out, and a tendency for some mild to moderate heartburn which she generally controls well by Tagamet.  With that background, the patient was in her usual state of health but began having low abdominal pain roughly half an hour after breakfast yesterday. This was not associated with nausea, vomiting, fevers, or chills.  It felt like a throbbing pain, as though she was being hit in the stomach with a hammer.  The pain eased off as the morning went on, but then recurred after eating lunch at a diner.  At this point the pain became so intense that she could barely walk and she had to be taken out of the diner in an ambulance. The pain began in the lower abdominal region and radiated all the way down to her vagina, up to the upper abdomen, into her back, and even into the back of her neck.  Again, no nausea, vomiting, fevers, or chills.  She underwent a CT scan of the abdomen at [facility] which was negative for any evidence of aortic aneurysm or dissection and was sent here for evaluation for possible mesenteric ischemia but that was not thought to be present  radiographically.  Overnight the patient did well and was pain-free this morning until coming back from an ultrasound (which showed no gallstones) and plain abdominal films (which showed stool throughout the colon, but no evidence of obstruction) after which time she had a clear liquid lunch and began having pain again which was quite severe and associated with the substantial rise in blood pressure.  She is now again basically pain-free roughly an hour or two later.  The patient has never really had similar symptoms to these in the past.  ALLERGIES:  No known allergies.  OUTPATIENT MEDICATIONS:  Tagamet p.r.n.  OPERATIONS:  Remote hysterectomy, also back surgery.  HABITS:  Nonsmoker, nondrinker.  FAMILY HISTORY:  Negative for GI tract illnesses such as colon cancer, ulcers, gallstones, inflammatory bowel disease, or liver disease.  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  REVIEW OF SYSTEMS:  Negative for anorexia or weight loss.  Sometimes her food is slow to go down the esophagus and is somewhat uncomfortable but there is no history of frank food impactions.  No chronic abdominal pain.  No rectal bleeding.  Constipation per HPI.  PHYSICAL EXAMINATION  GENERAL:  This is a slightly overweight, pleasant, healthy appearing female in absolutely no distress at the time of my evaluation.  HEENT:  She is anicteric and without evident pallor.  VITAL SIGNS:  Recently have  included blood pressure 155/60, heart rate 90, afebrile, O2 saturations normal.  CHEST:  Clear to auscultation anteriorly.  HEART:  No gallops, murmurs, rubs, clicks, or arrhythmias.  ABDOMEN:  Bowel sounds are somewhat hyperactive and seem to have low pitched rushes superimposed on a more normal gargling sound.  However, the abdomen is nondistended, soft, and completely nontender even to deep palpation and she denies any abdominal pain at present.  There is no  palpable hepatosplenomegaly.  No bruits are appreciated.  RECTAL:  Shows a generous amount of semi-scybalous stool in the rectum without a frank impaction.  No masses are appreciated.  Hemoccult negative on guaiac testing (control reacted appropriately).  LABORATORIES:  White count 9200, hemoglobin 11.4 with an MCV of 90 and an RDW normal at 12.8, platelets 268,000.  Basic metabolic panel is normal.  Total CKs are normal, although CK-MB is slightly elevated, probably not clinically relevant.  Troponin Is are normal.  Ultrasound negative.  Flat and upright abdomen (reviewed):  A generous amount of stool throughout the colon, but nothing dramatically impressive.  Outside CT scan:  I will review this with the radiologist.  To me it looks a little bit like there is some small bowel wall thickening but this may simply be a nondistended small bowel since no oral contrast was given.  There is no obvious small bowel dilatation to suggest obstruction.  IMPRESSION: 1. Recurring diffuse abdominal pain with radiation to vagina, back, and neck,    seemingly prompted by, or occurring shortly after, food consumption. 2. History of reflux disease. 3. Six-month history of constipation. 4. History of "cancerous" colonic polyp.  PLAN: 1. Review outside CT. 2. Obtain barium swallow with barium tablet to look for evidence of spasm or    esophageal structural abnormalities. 3. Increase PPI coverage to more completely take atypical acid reflux out of    the equation. 4. Initiate MiraLax in view of constipation to help maintain defecation,    especially while she is bed ridden. Dictated by:   Florencia Reasons, M.D. Attending Physician:  Caralee Ates. DD:  10/24/01 TD:  10/27/01 Job: [id number]

## 2010-10-05 NOTE — Cardiovascular Report (Signed)
[place]. [facility]  Patient:    Kelsey Luna, Kelsey Luna Visit Number: [id number] MRN: [id number]          Service Type: MED Location: MICU 2113 01 Attending Physician:  Veneda Melter Dictated by:   Rollene Rotunda, M.D. [facility] Proc. Date: 10/27/01 Admit Date:  10/23/2001 Discharge Date: 10/28/2001   CC:         Kathlee Nations, M.D.   Cardiac Catheterization  DATE OF BIRTH:  [birth year]  PRIMARY M.D.:  Kathlee Nations, M.D.  PROCEDURE:  Left heart catheterization and coronary arteriography.  INDICATIONS:  Evaluate a patient with unstable angina.  DESCRIPTION OF PROCEDURE:  Left heart catheterization was performed via the right femoral artery.  The artery was cannulated using anterior wall puncture. A #6 French arterial sheath was inserted using the modified Seldinger technique.  Preformed Judkins and pigtail catheter were utilized.  The patient tolerated the procedure well and left the lab in stable condition.  RESULTS:  HEMODYNAMICS:  LV 185/16, AO 189/85.  CORONARIES: 1. Left main was normal. 2. The LAD had a proximal 80% stenosis with haziness and a probable ruptured    plaque. 3. The circumflex was normal. 4. The right coronary artery was a dominant vessel and was normal.  LEFT VENTRICULOGRAM:  The left ventriculogram was obtained in the RAO projection. The EF was approximately 55% with well preserved wall motion.  CONCLUSION:  Severe single vessel coronary artery disease.  PLAN:  The patient will have percutaneous revascularization per Dr. Juanda Chance. She will have secondary risk factor modification. Dictated by:   Rollene Rotunda, M.D. [facility] Attending Physician:  Veneda Melter DD:  10/27/01 TD:  10/28/01 Job: [id number]

## 2010-11-01 ENCOUNTER — Encounter: Payer: Self-pay | Admitting: Cardiology

## 2011-02-07 ENCOUNTER — Encounter: Payer: Self-pay | Admitting: Cardiology

## 2011-02-08 ENCOUNTER — Encounter: Payer: Self-pay | Admitting: Cardiology

## 2011-03-12 ENCOUNTER — Encounter: Payer: Self-pay | Admitting: Cardiology

## 2011-03-15 ENCOUNTER — Ambulatory Visit (INDEPENDENT_AMBULATORY_CARE_PROVIDER_SITE_OTHER): Payer: Medicare Other | Admitting: Cardiology

## 2011-03-15 ENCOUNTER — Encounter: Payer: Self-pay | Admitting: Cardiology

## 2011-03-15 VITALS — BP 131/77 | HR 55 | Ht 63.0 in | Wt 147.0 lb

## 2011-03-15 DIAGNOSIS — I2510 Atherosclerotic heart disease of native coronary artery without angina pectoris: Secondary | ICD-10-CM

## 2011-03-15 DIAGNOSIS — I10 Essential (primary) hypertension: Secondary | ICD-10-CM

## 2011-03-15 DIAGNOSIS — E785 Hyperlipidemia, unspecified: Secondary | ICD-10-CM

## 2011-03-15 NOTE — Assessment & Plan Note (Signed)
Blood pressure well-controlled today. 

## 2011-03-15 NOTE — Progress Notes (Signed)
Clinical Summary Kelsey Luna is a 77 y.o.female presenting for followup. She was seen in April.  She has been doing reasonably well without progressive angina or shortness of breath. She uses a walker at all times to steady herself. She is back now her own home after staying with family members for 11 months following a previous fall with arm fracture.  She reports compliance with medications, and recent lab work with Dr. Maryellen Pile. She states her cholesterol was somewhat better. We are requesting the results for review.  Followup ECG was reviewed.  Allergies  Allergen Reactions  . Pseudoephedrine Other (See Comments)    Pt states she lost her memory for a day when she took Sudafed.     Medication list reviewed.  Past Medical History  Diagnosis Date  . CAD (coronary artery disease)     DES LAD 6/03, LVEF 67%  . Hyperlipidemia   . Hypertension   . GERD (gastroesophageal reflux disease)   . Hiatal hernia   . Anxiety   . Depression     Past Surgical History  Procedure Date  . Cataracts   . Nasal sinus surgery   . Tonsilectomy, adenoidectomy, bilateral myringotomy and tubes   . Total abdominal hysterectomy   . Hemorrhoid surgery   . Back surgery     Family History  Problem Relation Age of Onset  . Coronary artery disease Father   . Cancer Father   . Diabetes Father   . Diabetes Mother   . Diabetes Sister     Social History [name] reports that she has never smoked. She has never used smokeless tobacco. [name] reports that she does not drink alcohol.  Review of Systems No palpitations, dizziness, syncope. Stable appetite. Otherwise negative.  Physical Examination Filed Vitals:   03/15/11 1421  BP: 131/77  Pulse: 55    Normally nourished appearing woman in no acute distress.  HEENT: Conjunctiva and lids normal, oropharynx clear.  Neck: Supple, no carotid bruits, no thyromegaly.  Lungs: Clear without labored breathing.  Cardiac: Regular rate and rhythm, no S3  gallop.  Abdomen: Soft, no hepatomegaly, bowel sounds present.  Extremities: No pitting edema, distal pulses 2+.  Skin: Warm and dry.  Musculoskeletal: No kyphosis.  Neuropsychiatric: Alert and oriented x3, affect appropriate.   ECG Reviewed in EMR.   Problem List and Plan

## 2011-03-15 NOTE — Patient Instructions (Signed)
Your physician wants you to follow-up in: 6 months. You will receive a reminder letter in the mail one-two months in advance. If you don't receive a letter, please call our office to schedule the follow-up appointment. Your physician recommends that you continue on your current medications as directed. Please refer to the Current Medication list given to you today. 

## 2011-03-15 NOTE — Assessment & Plan Note (Signed)
Will request recent lab work from Dr. Maryellen Pile.

## 2011-03-15 NOTE — Assessment & Plan Note (Signed)
Symptomatically stable on medical therapy. Followup ECG reviewed. Continue observation.

## 2011-03-28 ENCOUNTER — Ambulatory Visit: Payer: Medicare Other | Admitting: Cardiology

## 2011-08-28 ENCOUNTER — Other Ambulatory Visit: Payer: Self-pay | Admitting: Internal Medicine

## 2011-09-17 ENCOUNTER — Encounter: Payer: Self-pay | Admitting: Cardiology

## 2011-09-17 ENCOUNTER — Ambulatory Visit (INDEPENDENT_AMBULATORY_CARE_PROVIDER_SITE_OTHER): Payer: Medicare Other | Admitting: Cardiology

## 2011-09-17 VITALS — BP 154/76 | HR 54 | Ht 63.0 in | Wt 140.0 lb

## 2011-09-17 DIAGNOSIS — I10 Essential (primary) hypertension: Secondary | ICD-10-CM

## 2011-09-17 DIAGNOSIS — I2510 Atherosclerotic heart disease of native coronary artery without angina pectoris: Secondary | ICD-10-CM

## 2011-09-17 DIAGNOSIS — E785 Hyperlipidemia, unspecified: Secondary | ICD-10-CM

## 2011-09-17 NOTE — Assessment & Plan Note (Signed)
Symptomatically stable on medical therapy. ECG also stable. Continue activity as tolerated, followup arranged.

## 2011-09-17 NOTE — Assessment & Plan Note (Signed)
Blood pressure is elevated today. No changes made to her regimen. Discussed sodium restriction. Could always consider a combination ACE inhibitor with low-dose diuretic.

## 2011-09-17 NOTE — Assessment & Plan Note (Signed)
Will request recent lab work for review. 

## 2011-09-17 NOTE — Progress Notes (Signed)
Clinical Summary Ms. Valiente is a 78 y.o.female presenting for followup. She was seen in October 2012. She is here with her daughter. Still using a walker to ambulate, had one fall without major injury recently. No palpitations or frank syncope. She continues to deny any angina or nitroglycerin requirement.  Lab work from last October showed normal AST and ALT, cholesterol 140, triglycerides 86, HDL 47, LDL 75. Followup lab work was done a few weeks ago and will be requested for review.  We reviewed her medications and plan for continued observation in the setting of symptom stability.   Allergies  Allergen Reactions  . Pseudoephedrine Other (See Comments)    Pt states she lost her memory for a day when she took Sudafed.     Current Outpatient Prescriptions  Medication Sig Dispense Refill  . ALPRAZolam (XANAX) 1 MG tablet Take 1-2 tablets by mouth once a day at bedtime as needed      . aspirin 325 MG tablet Take 325 mg by mouth daily.        Marland Kitchen buPROPion (BUDEPRION XL) 300 MG 24 hr tablet Take 300 mg by mouth daily.        . BUSPIRONE HCL PO Take 10 mg by mouth 3 (three) times daily.       . Calcium Carbonate-Vitamin D (CALTRATE 600+D) 600-400 MG-UNIT per tablet Take 2 tablets by mouth daily.       . fentaNYL (DURAGESIC - DOSED MCG/HR) 50 MCG/HR Place 1 patch onto the skin every 3 (three) days.        Marland Kitchen lisinopril (PRINIVIL,ZESTRIL) 20 MG tablet Take 20 mg by mouth 2 (two) times daily.       Marland Kitchen METOPROLOL TARTRATE PO Take 25 mg by mouth 2 (two) times daily. Take 1 tablet by mouth two times a day         . Multiple Vitamin (MULTIVITAMIN PO) Take by mouth daily.        . Omega-3 Fatty Acids (FISH OIL) 1200 MG CAPS Take 2 capsules by mouth daily.       Marland Kitchen omeprazole (PRILOSEC) 20 MG capsule Take 20 mg by mouth daily.        . polyethylene glycol (MIRALAX) powder Take 17 g by mouth daily.       . Potassium 99 MG TABS Take 1 tablet by mouth daily.        . simvastatin (ZOCOR) 20 MG tablet  Take 20 mg by mouth every evening.        Past Medical History  Diagnosis Date  . CAD (coronary artery disease)     DES LAD 6/03, LVEF 67%  . Hyperlipidemia   . Hypertension   . GERD (gastroesophageal reflux disease)   . Hiatal hernia   . Anxiety   . Depression     Social History Ms. Lanz reports that she has never smoked. She has never used smokeless tobacco. Ms. Baynes reports that she does not drink alcohol.  Review of Systems No palpitations or lightheadedness. Reports stable appetite. Has chronic leg edema, left worse than right. No orthopnea. Otherwise negative except as outlined.  Physical Examination Filed Vitals:   09/17/11 1321  BP: 154/76  Pulse: 54    Normally nourished appearing woman in no acute distress.  HEENT: Conjunctiva and lids normal, oropharynx clear.  Neck: Supple, no carotid bruits, no thyromegaly.  Lungs: Clear without labored breathing.  Cardiac: Regular rate and rhythm, no S3 gallop.  Abdomen: Soft, no hepatomegaly, bowel  sounds present.  Extremities: No pitting edema, distal pulses 2+.  Skin: Warm and dry.  Musculoskeletal: No kyphosis.  Neuropsychiatric: Alert and oriented x3, affect appropriate.   ECG Head sinus bradycardia at 54, nonspecific ST-T changes.   Problem List and Plan

## 2011-09-17 NOTE — Patient Instructions (Signed)
Your physician wants you to follow-up in: 6 months. You will receive a reminder letter in the mail one-two months in advance. If you don't receive a letter, please call our office to schedule the follow-up appointment. Your physician recommends that you continue on your current medications as directed. Please refer to the Current Medication list given to you today. We have requested your recent labs from Dr. Maryellen Pile.

## 2013-06-06 ENCOUNTER — Emergency Department (HOSPITAL_COMMUNITY): Payer: Medicare Other

## 2013-06-06 ENCOUNTER — Encounter (HOSPITAL_COMMUNITY): Payer: Self-pay | Admitting: Emergency Medicine

## 2013-06-06 ENCOUNTER — Emergency Department (HOSPITAL_COMMUNITY)
Admission: EM | Admit: 2013-06-06 | Discharge: 2013-06-06 | Disposition: A | Discharge status: Home / Self Care | Payer: Medicare Other | Attending: Emergency Medicine | Admitting: Emergency Medicine

## 2013-06-06 DIAGNOSIS — S8390XA Sprain of unspecified site of unspecified knee, initial encounter: Secondary | ICD-10-CM

## 2013-06-06 DIAGNOSIS — W010XXA Fall on same level from slipping, tripping and stumbling without subsequent striking against object, initial encounter: Secondary | ICD-10-CM | POA: Insufficient documentation

## 2013-06-06 DIAGNOSIS — I10 Essential (primary) hypertension: Secondary | ICD-10-CM | POA: Insufficient documentation

## 2013-06-06 DIAGNOSIS — W19XXXA Unspecified fall, initial encounter: Secondary | ICD-10-CM

## 2013-06-06 DIAGNOSIS — I2510 Atherosclerotic heart disease of native coronary artery without angina pectoris: Secondary | ICD-10-CM | POA: Insufficient documentation

## 2013-06-06 DIAGNOSIS — S76019A Strain of muscle, fascia and tendon of unspecified hip, initial encounter: Secondary | ICD-10-CM

## 2013-06-06 DIAGNOSIS — S8000XA Contusion of unspecified knee, initial encounter: Secondary | ICD-10-CM | POA: Insufficient documentation

## 2013-06-06 DIAGNOSIS — Y9389 Activity, other specified: Secondary | ICD-10-CM | POA: Insufficient documentation

## 2013-06-06 DIAGNOSIS — F411 Generalized anxiety disorder: Secondary | ICD-10-CM | POA: Insufficient documentation

## 2013-06-06 DIAGNOSIS — E785 Hyperlipidemia, unspecified: Secondary | ICD-10-CM | POA: Insufficient documentation

## 2013-06-06 DIAGNOSIS — Z7982 Long term (current) use of aspirin: Secondary | ICD-10-CM | POA: Insufficient documentation

## 2013-06-06 DIAGNOSIS — F329 Major depressive disorder, single episode, unspecified: Secondary | ICD-10-CM | POA: Insufficient documentation

## 2013-06-06 DIAGNOSIS — IMO0002 Reserved for concepts with insufficient information to code with codable children: Secondary | ICD-10-CM | POA: Insufficient documentation

## 2013-06-06 DIAGNOSIS — Z79899 Other long term (current) drug therapy: Secondary | ICD-10-CM | POA: Insufficient documentation

## 2013-06-06 DIAGNOSIS — F3289 Other specified depressive episodes: Secondary | ICD-10-CM | POA: Insufficient documentation

## 2013-06-06 DIAGNOSIS — K219 Gastro-esophageal reflux disease without esophagitis: Secondary | ICD-10-CM | POA: Insufficient documentation

## 2013-06-06 DIAGNOSIS — Y92009 Unspecified place in unspecified non-institutional (private) residence as the place of occurrence of the external cause: Secondary | ICD-10-CM

## 2013-06-06 MED ORDER — HYDROCODONE-ACETAMINOPHEN 5-325 MG PO TABS: ORAL_TABLET | ORAL | Status: DC

## 2013-06-06 NOTE — ED Notes (Signed)
Complain of pain in left hip, leg and low back from a fall yesterday

## 2013-06-06 NOTE — ED Provider Notes (Signed)
CSN: [id number]     Arrival date & time 06/06/13  1443 History  This chart was scribed for Gavin PoundMichael Y. Oletta LamasGhim, MD by Blanchard KelchNicole Curnes, ED Scribe. The patient was seen in room APA08/APA08. Patient's care was started at 3:43 PM.    Chief Complaint  Patient presents with  . Hip Pain    Patient is a 80 y.o. female presenting with hip pain. The history is provided by the patient and a relative. No language interpreter was used.  Hip Pain    HPI Comments: Kelsey Luna is a 80 y.o. female with a history of bulging discs, brought in by ambulance who presents to the Emergency Department due to a fall that occurred yesterday when she tripped while in a closet and fell backward. She states that her left leg went backwards as she fell, causing her to fall on her leg and left hip. She was unable to get up on her own after trying for thirty mintes, and her daughter states she and her husband helped her back up as soon as they got to her house. She is complaining of constant pain to her left hip and leg onset immediately after the fall occurred. She describes the pain as burning. The pain is worsened with lifting the leg, but she is able to keep it elevated. However, the daughter states her mother has been ambulating with a limp on her left side since the fall but can bear weight on the leg. She is a Fentanyl patch at baseline with mild relief of the pain. The daughter also states that the patient's oxygen levels were low in the EMS and she was placed on 2L in the ED. She denies feeling short of breath. She is not on oxygen at home.   Past Medical History  Diagnosis Date  . CAD (coronary artery disease)     DES LAD 6/03, LVEF 67%  . Hyperlipidemia   . Hypertension   . GERD (gastroesophageal reflux disease)   . Hiatal hernia   . Anxiety   . Depression    Past Surgical History  Procedure Laterality Date  . Cataracts    . Nasal sinus surgery    . Tonsilectomy, adenoidectomy, bilateral myringotomy and  tubes    . Total abdominal hysterectomy    . Hemorrhoid surgery    . Back surgery     Family History  Problem Relation Age of Onset  . Coronary artery disease Father   . Cancer Father   . Diabetes Father   . Diabetes Mother   . Diabetes Sister    History  Substance Use Topics  . Smoking status: Never Smoker   . Smokeless tobacco: Never Used  . Alcohol Use: No   OB History   Grav Para Term Preterm Abortions TAB SAB Ect Mult Living                 Review of Systems  Musculoskeletal: Positive for arthralgias and gait problem.  All other systems reviewed and are negative.    Allergies  Pseudoephedrine  Home Medications   Current Outpatient Rx  Name  Route  Sig  Dispense  Refill  . ALPRAZolam (XANAX) 1 MG tablet   Oral   Take 1 mg by mouth daily.         Marland Kitchen. aspirin 325 MG tablet   Oral   Take 325 mg by mouth daily.           Marland Kitchen. buPROPion (WELLBUTRIN XL) 300 MG  24 hr tablet   Oral   Take 300 mg by mouth daily.         . Calcium Carbonate-Vitamin D (CALTRATE 600+D) 600-400 MG-UNIT per tablet   Oral   Take 2 tablets by mouth daily.          . fentaNYL (DURAGESIC - DOSED MCG/HR) 50 MCG/HR   Transdermal   Place 1 patch onto the skin every 3 (three) days.           . Garlic 1000 MG CAPS   Oral   Take 1,000 mg by mouth daily.         . hydrochlorothiazide (HYDRODIURIL) 25 MG tablet   Oral   Take 25 mg by mouth daily.         Marland Kitchen lisinopril (PRINIVIL,ZESTRIL) 10 MG tablet   Oral   Take 10 mg by mouth daily.         . metoprolol (LOPRESSOR) 50 MG tablet   Oral   Take 25 mg by mouth 2 (two) times daily.         . Multiple Vitamin (MULTIVITAMIN PO)   Oral   Take 1 tablet by mouth daily.          . Omega-3 Fatty Acids (FISH OIL) 1000 MG CAPS   Oral   Take 1,000 mg by mouth daily.         Marland Kitchen omeprazole (PRILOSEC) 20 MG capsule   Oral   Take 20 mg by mouth daily.           Marland Kitchen omeprazole (PRILOSEC) 20 MG capsule   Oral   Take 20 mg  by mouth daily.         . polyethylene glycol (MIRALAX) powder   Oral   Take 17 g by mouth daily.          . Potassium 99 MG TABS   Oral   Take 1 tablet by mouth daily.           . simvastatin (ZOCOR) 20 MG tablet   Oral   Take 20 mg by mouth every evening.          Triage Vitals: BP 128/65  Pulse 69  Temp(Src) 97.8 F (36.6 C) (Oral)  Resp 18  Ht 5\' 4"  (1.626 m)  Wt 130 lb (58.968 kg)  BMI 22.30 kg/m2  SpO2 98%  Physical Exam  Nursing note and vitals reviewed. Constitutional: She is oriented to person, place, and time. She appears well-developed and well-nourished. No distress.  HENT:  Head: Normocephalic and atraumatic.  Eyes: EOM are normal.  Neck: Neck supple. No tracheal deviation present.  Cardiovascular: Normal rate and regular rhythm.   No murmur heard. Pulses:      Dorsalis pedis pulses are 2+ on the right side, and 2+ on the left side.  Pulmonary/Chest: Effort normal. No respiratory distress. She has no wheezes. She has no rales.  Abdominal: Soft. There is no tenderness. There is no rebound and no guarding.  Musculoskeletal: Normal range of motion. She exhibits tenderness.  Pelvis stable and non tender. Small effusion left knee. No lumbar tenderness or deformity noted.    Neurological: She is alert and oriented to person, place, and time.  Good plantar and dorsi flexion left side.   Skin: Skin is warm and dry.  Abrasion below left knee cap.  Bruise on lateral aspect of left knee.   Psychiatric: She has a normal mood and affect. Her behavior is normal.  ED Course  Procedures (including critical care time)  DIAGNOSTIC STUDIES: Oxygen Saturation is 98% on room air, normal by my interpretation.    COORDINATION OF CARE: 3:48 PM -Will order left knee, femur, hip and lumbar spine x-rays. Patient verbalizes understanding and agrees with treatment plan.  6:16 PM Results of plain films discussed with family.  RICE therapy at home.  Follow up with PCP  next week if not steadily improving.  Pt can bear some weight on left lower extremity without difficulty, just when she tries to abduct at hip.  Will give a short Rx for norco to be taken as needed.  Family is agreeable  Labs Review Labs Reviewed - No data to display Imaging Review Dg Lumbar Spine Complete  06/06/2013   CLINICAL DATA:  Multifocal left lower extremity pain after multiple falls over the last several days.  EXAM: LUMBAR SPINE - COMPLETE 4+ VIEW  COMPARISON:  MR LUMBAR SPINE W/O CM dated 08/26/2011; DG LUMBAR SPINE 2-3V dated 08/26/2011  FINDINGS: There are 5 lumbar type vertebral bodies with a convex left scoliosis centered at L1-2. The lateral alignment is near anatomic. There is progressive endplate sclerosis and osteophyte formation on the right at L2-3. No endplate destruction, acute fracture or pars defect is identified. There is diffuse aortoiliac atherosclerosis.  IMPRESSION: Progressive disc and endplate degeneration at L2-3. No acute osseous findings identified.   Electronically Signed   By: Roxy Horseman M.D.   On: 06/06/2013 16:54   Dg Hip Complete Left  06/06/2013   CLINICAL DATA:  Multifocal left lower extremity pain after multiple falls over the last several days.  EXAM: LEFT HIP - COMPLETE 2+ VIEW  COMPARISON:  Left hip radiographs 03/17/2010.  FINDINGS: The bones are demineralized. There is no evidence of acute fracture, dislocation or femoral head avascular necrosis. The joint spaces appear maintained. Lower lumbar spondylosis and scoliosis are noted.  IMPRESSION: No evidence of acute left hip injury.   Electronically Signed   By: Roxy Horseman M.D.   On: 06/06/2013 16:51   Dg Femur Left  06/06/2013   CLINICAL DATA:  Multifocal left lower extremity pain after multiple falls over the last several days.  EXAM: LEFT FEMUR - 2 VIEW  COMPARISON:  03/17/2010 left hip radiographs.  FINDINGS: The bones appear mildly demineralized. There is no evidence of acute fracture, dislocation or  focal soft tissue swelling. Vascular calcifications are noted.  IMPRESSION: No acute osseous findings.   Electronically Signed   By: Roxy Horseman M.D.   On: 06/06/2013 16:50   Dg Knee Complete 4 Views Left  06/06/2013   CLINICAL DATA:  Multifocal left lower extremity pain after multiple falls over the last several days.  EXAM: LEFT KNEE - COMPLETE 4+ VIEW  COMPARISON:  None.  FINDINGS: The bones are demineralized. There is mild medial and patellofemoral compartment joint space loss. There is a small knee joint effusion. No acute fracture or dislocation is identified. Scattered vascular calcifications are noted.  IMPRESSION: Osteopenia with mild degenerative changes and small joint effusion. No acute osseous findings identified.   Electronically Signed   By: Roxy Horseman M.D.   On: 06/06/2013 16:49    EKG Interpretation   None       MDM   1. Hip strain   2. Knee sprain   3. Fall at home      I personally performed the services described in this documentation, which was scribed in my presence. The recorded information has been reviewed  and considered.   Pt with mechanical fall yesterday, has been bearing weight, but difficulty moving left leg forward at hip.  However pt is able to actively flex and hold leg up from laying position in bed with knee straight.  Doubt fracture.  Will still get plain films.  Howevere I suspect pt has a ligament strain in both knee and also in groin or left hip region which is making it difficult for her to ambulate.  Pt can stay with daughter and pt has a walker already.  Will encourage RICE if can tolerate and will offer Rx for pain if desired.     Gavin Pound. Oletta Lamas, MD 06/06/13 1816

## 2013-06-06 NOTE — Discharge Instructions (Signed)
Narcotic and benzodiazepine use may cause drowsiness, slowed breathing or dependence.  Please use with caution and do not drive, operate machinery or watch young children alone while taking them.  Taking combinations of these medications or drinking alcohol will potentiate these effects.    

## 2013-06-06 NOTE — ED Notes (Signed)
EDP went in to assess pt and requested pt be taken off oxygen to see how well pt tolerated room air. Pt pulse ox placed and oxygen removed. Pt v/s 97% on room air. nad noted.

## 2013-06-06 NOTE — ED Notes (Signed)
Rx and discharge instructions given to granddaughter. [facility]

## 2015-04-24 ENCOUNTER — Emergency Department (HOSPITAL_COMMUNITY): Payer: Medicare Other

## 2015-04-24 ENCOUNTER — Observation Stay (HOSPITAL_COMMUNITY)
Admission: EM | Admit: 2015-04-24 | Discharge: 2015-04-26 | Disposition: A | Discharge status: Home / Self Care | Payer: Medicare Other | Attending: Internal Medicine | Admitting: Internal Medicine

## 2015-04-24 ENCOUNTER — Encounter (HOSPITAL_COMMUNITY): Payer: Self-pay | Admitting: *Deleted

## 2015-04-24 DIAGNOSIS — F419 Anxiety disorder, unspecified: Secondary | ICD-10-CM | POA: Insufficient documentation

## 2015-04-24 DIAGNOSIS — Y9289 Other specified places as the place of occurrence of the external cause: Secondary | ICD-10-CM | POA: Insufficient documentation

## 2015-04-24 DIAGNOSIS — I10 Essential (primary) hypertension: Secondary | ICD-10-CM | POA: Diagnosis not present

## 2015-04-24 DIAGNOSIS — S0990XA Unspecified injury of head, initial encounter: Secondary | ICD-10-CM | POA: Diagnosis not present

## 2015-04-24 DIAGNOSIS — Y9389 Activity, other specified: Secondary | ICD-10-CM | POA: Insufficient documentation

## 2015-04-24 DIAGNOSIS — Y998 Other external cause status: Secondary | ICD-10-CM | POA: Insufficient documentation

## 2015-04-24 DIAGNOSIS — E785 Hyperlipidemia, unspecified: Secondary | ICD-10-CM | POA: Diagnosis not present

## 2015-04-24 DIAGNOSIS — F329 Major depressive disorder, single episode, unspecified: Secondary | ICD-10-CM | POA: Diagnosis not present

## 2015-04-24 DIAGNOSIS — IMO0002 Reserved for concepts with insufficient information to code with codable children: Secondary | ICD-10-CM

## 2015-04-24 DIAGNOSIS — W01198A Fall on same level from slipping, tripping and stumbling with subsequent striking against other object, initial encounter: Secondary | ICD-10-CM | POA: Insufficient documentation

## 2015-04-24 DIAGNOSIS — W19XXXA Unspecified fall, initial encounter: Secondary | ICD-10-CM

## 2015-04-24 DIAGNOSIS — M545 Low back pain, unspecified: Secondary | ICD-10-CM | POA: Diagnosis present

## 2015-04-24 DIAGNOSIS — I2510 Atherosclerotic heart disease of native coronary artery without angina pectoris: Secondary | ICD-10-CM | POA: Diagnosis not present

## 2015-04-24 DIAGNOSIS — M4120 Other idiopathic scoliosis, site unspecified: Secondary | ICD-10-CM | POA: Diagnosis present

## 2015-04-24 DIAGNOSIS — S79911A Unspecified injury of right hip, initial encounter: Secondary | ICD-10-CM | POA: Diagnosis present

## 2015-04-24 DIAGNOSIS — Y92009 Unspecified place in unspecified non-institutional (private) residence as the place of occurrence of the external cause: Secondary | ICD-10-CM

## 2015-04-24 DIAGNOSIS — K219 Gastro-esophageal reflux disease without esophagitis: Secondary | ICD-10-CM | POA: Diagnosis not present

## 2015-04-24 LAB — CBC WITH DIFFERENTIAL/PLATELET
BASOS PCT: 0 %
Basophils Absolute: 0 10*3/uL (ref 0.0–0.1)
EOS ABS: 0.1 10*3/uL (ref 0.0–0.7)
EOS PCT: 1 %
HCT: 38.1 % (ref 36.0–46.0)
Hemoglobin: 13.1 g/dL (ref 12.0–15.0)
LYMPHS ABS: 1.3 10*3/uL (ref 0.7–4.0)
Lymphocytes Relative: 12 %
MCH: 31.3 pg (ref 26.0–34.0)
MCHC: 34.4 g/dL (ref 30.0–36.0)
MCV: 91.1 fL (ref 78.0–100.0)
MONOS PCT: 6 %
Monocytes Absolute: 0.7 10*3/uL (ref 0.1–1.0)
Neutro Abs: 9.1 10*3/uL — ABNORMAL HIGH (ref 1.7–7.7)
Neutrophils Relative %: 81 %
PLATELETS: 299 10*3/uL (ref 150–400)
RBC: 4.18 MIL/uL (ref 3.87–5.11)
RDW: 12.6 % (ref 11.5–15.5)
WBC: 11.2 10*3/uL — AB (ref 4.0–10.5)

## 2015-04-24 LAB — COMPREHENSIVE METABOLIC PANEL
ALT: 24 U/L (ref 14–54)
AST: 27 U/L (ref 15–41)
Albumin: 3.9 g/dL (ref 3.5–5.0)
Alkaline Phosphatase: 63 U/L (ref 38–126)
Anion gap: 11 (ref 5–15)
BUN: 19 mg/dL (ref 6–20)
CHLORIDE: 102 mmol/L (ref 101–111)
CO2: 23 mmol/L (ref 22–32)
CREATININE: 0.71 mg/dL (ref 0.44–1.00)
Calcium: 10.2 mg/dL (ref 8.9–10.3)
GFR calc Af Amer: >60 mL/min (ref >60)
GFR calc non Af Amer: >60 mL/min (ref >60)
Glucose, Bld: 101 mg/dL — ABNORMAL HIGH (ref 65–99)
POTASSIUM: 3.5 mmol/L (ref 3.5–5.1)
SODIUM: 136 mmol/L (ref 135–145)
Total Bilirubin: 0.8 mg/dL (ref 0.3–1.2)
Total Protein: 6.8 g/dL (ref 6.5–8.1)

## 2015-04-24 MED ORDER — ONDANSETRON HCL 4 MG/2ML IJ SOLN
4.0000 mg | Freq: Once | INTRAMUSCULAR | Status: AC
  Administered 2015-04-24: 4 mg via INTRAVENOUS

## 2015-04-24 MED ORDER — ONDANSETRON HCL 4 MG/2ML IJ SOLN
INTRAMUSCULAR | Status: AC
  Administered 2015-04-24: 4 mg via INTRAVENOUS
  Filled 2015-04-24: qty 2

## 2015-04-24 MED ORDER — DIAZEPAM 5 MG/ML IJ SOLN
2.5000 mg | Freq: Once | INTRAMUSCULAR | Status: AC
  Administered 2015-04-24: 2.5 mg via INTRAVENOUS
  Filled 2015-04-24: qty 2

## 2015-04-24 MED ORDER — MORPHINE SULFATE (PF) 4 MG/ML IV SOLN
6.0000 mg | Freq: Once | INTRAVENOUS | Status: AC
  Administered 2015-04-25: 6 mg via INTRAVENOUS
  Filled 2015-04-24: qty 2

## 2015-04-24 MED ORDER — MORPHINE SULFATE (PF) 4 MG/ML IV SOLN
6.0000 mg | Freq: Once | INTRAVENOUS | Status: AC
  Administered 2015-04-24: 6 mg via INTRAVENOUS
  Filled 2015-04-24: qty 2

## 2015-04-24 NOTE — ED Notes (Signed)
Pt states that she was putting on her coat when she became tangled in the coat, causing her to fall, hitting her head, c/o pain to hips and mid back area, cms intact all extremities,

## 2015-04-24 NOTE — ED Notes (Signed)
Pt was putting her coat on and fell back on her back, hitting the back of her head; pt is c/o of severe pain to her right hip

## 2015-04-24 NOTE — ED Provider Notes (Signed)
CSN: [id number]     Arrival date & time 04/24/15  2040 History   First MD Initiated Contact with Patient 04/24/15 2043     Chief Complaint  Patient presents with  . Fall     (Consider location/radiation/quality/duration/timing/severity/associated sxs/prior Treatment) Patient is a 81 y.o. female presenting with fall and hip pain. The history is provided by the patient.  Fall This is a new problem. The current episode started less than 1 hour ago. The problem occurs constantly. The problem has not changed since onset.Pertinent negatives include no chest pain, no abdominal pain, no headaches and no shortness of breath.  Hip Pain This is a new problem. The current episode started less than 1 hour ago. The problem occurs constantly. The problem has not changed since onset.Pertinent negatives include no chest pain, no abdominal pain, no headaches and no shortness of breath. Nothing aggravates the symptoms. Nothing relieves the symptoms. She has tried nothing for the symptoms. The treatment provided no relief.    Past Medical History  Diagnosis Date  . CAD (coronary artery disease)     DES LAD 6/03, LVEF 67%  . Hyperlipidemia   . Hypertension   . GERD (gastroesophageal reflux disease)   . Hiatal hernia   . Anxiety   . Depression    Past Surgical History  Procedure Laterality Date  . Cataracts    . Nasal sinus surgery    . Tonsilectomy, adenoidectomy, bilateral myringotomy and tubes    . Total abdominal hysterectomy    . Hemorrhoid surgery    . Back surgery    . Cholecystectomy     Family History  Problem Relation Age of Onset  . Coronary artery disease Father   . Cancer Father   . Diabetes Father   . Diabetes Mother   . Diabetes Sister    Social History  Substance Use Topics  . Smoking status: Never Smoker   . Smokeless tobacco: Never Used  . Alcohol Use: No   OB History    No data available     Review of Systems  Constitutional: Negative for fever, chills and  fatigue.  HENT: Negative for congestion and facial swelling.   Eyes: Negative for discharge and redness.  Respiratory: Negative for cough and shortness of breath.   Cardiovascular: Negative for chest pain and palpitations.  Gastrointestinal: Negative for abdominal pain and abdominal distention.  Endocrine: Negative for polydipsia and polyuria.  Genitourinary: Negative for urgency and pelvic pain.  Musculoskeletal: Negative for back pain.       Right hip/pelvis pain  Skin: Negative for wound.  Neurological: Negative for headaches.  All other systems reviewed and are negative.     Allergies  Pseudoephedrine  Home Medications   Prior to Admission medications   Medication Sig Start Date End Date Taking? Authorizing Provider  ALPRAZolam Prudy Feeler) 0.5 MG tablet Take 0.25-1 mg by mouth 2 (two) times daily. Half tablet in the morning and two tabs at bedtime   Yes Historical Provider, MD  Ascorbic Acid (VITAMIN C) 1000 MG tablet Take 1,000 mg by mouth every morning.   Yes Historical Provider, MD  buPROPion (WELLBUTRIN XL) 300 MG 24 hr tablet Take 300 mg by mouth daily.   Yes Historical Provider, MD  busPIRone (BUSPAR) 15 MG tablet Take 15 mg by mouth 2 (two) times daily.   Yes Historical Provider, MD  Cranberry-Cholecalciferol (SUPER CRANBERRY/VITAMIN D3) 4200-500 MG-UNIT CAPS Take 1 capsule by mouth every morning.   Yes Historical Provider, MD  hydrochlorothiazide (  HYDRODIURIL) 25 MG tablet Take 25 mg by mouth daily.   Yes Historical Provider, MD  lisinopril (PRINIVIL,ZESTRIL) 20 MG tablet Take 20 mg by mouth every morning.   Yes Historical Provider, MD  magnesium oxide (MAG-OX) 400 MG tablet Take 400 mg by mouth every morning.   Yes Historical Provider, MD  Multiple Vitamin (MULTIVITAMIN WITH MINERALS) TABS tablet Take 1 tablet by mouth every morning.   Yes Historical Provider, MD  omeprazole (PRILOSEC) 20 MG capsule Take 20 mg by mouth daily.     Yes Historical Provider, MD  polyethylene  glycol (MIRALAX) powder Take 17 g by mouth daily.    Yes Historical Provider, MD  Potassium 99 MG TABS Take 1 tablet by mouth every morning.    Yes Historical Provider, MD  acetaminophen (TYLENOL) 325 MG tablet Take 1 tablet (325 mg total) by mouth every 6 (six) hours as needed for mild pain. 04/26/15   Leana Roe Elgergawy, MD  methocarbamol (ROBAXIN) 500 MG tablet Take 1 tablet (500 mg total) by mouth every 6 (six) hours as needed for muscle spasms. 04/26/15   Leana Roe Elgergawy, MD  oxyCODONE (OXY IR/ROXICODONE) 5 MG immediate release tablet Take 1 tablet (5 mg total) by mouth every 6 (six) hours as needed for severe pain. 04/26/15   Leana Roe Elgergawy, MD   BP 143/55 mmHg  Pulse 55  Temp(Src) 97.5 F (36.4 C) (Oral)  Resp 20  Ht 5' (1.524 m)  Wt 131 lb 2.8 oz (59.5 kg)  BMI 25.62 kg/m2  SpO2 92% Physical Exam  Constitutional: She is oriented to person, place, and time. She appears well-developed and well-nourished.  HENT:  Head: Normocephalic and atraumatic.  Eyes: Conjunctivae are normal. Pupils are equal, round, and reactive to light.  Neck: Normal range of motion.  Cardiovascular: Normal rate and regular rhythm.   Pulmonary/Chest: Effort normal. No stridor. No respiratory distress.  Abdominal: Soft. She exhibits no distension. There is no tenderness.  Musculoskeletal: Normal range of motion. She exhibits tenderness (with ROM of right hip). She exhibits no edema.  Neurological: She is alert and oriented to person, place, and time.  Skin: Skin is warm and dry. No rash noted. No erythema.  Nursing note and vitals reviewed.   ED Course  Procedures (including critical care time) Labs Review Labs Reviewed  CBC WITH DIFFERENTIAL/PLATELET - Abnormal; Notable for the following:    WBC 11.2 (*)    Neutro Abs 9.1 (*)    All other components within normal limits  COMPREHENSIVE METABOLIC PANEL - Abnormal; Notable for the following:    Glucose, Bld 101 (*)    All other components within  normal limits  URINALYSIS, ROUTINE W REFLEX MICROSCOPIC (NOT AT [facility]) - Abnormal; Notable for the following:    APPearance HAZY (*)    Leukocytes, UA SMALL (*)    All other components within normal limits  URINE MICROSCOPIC-ADD ON - Abnormal; Notable for the following:    Squamous Epithelial / LPF 0-5 (*)    Bacteria, UA MANY (*)    All other components within normal limits    Imaging Review No results found. I have personally reviewed and evaluated these images and lab results as part of my medical decision-making.   EKG Interpretation   Date/Time:  Monday April 24 2015 20:53:18 EST Ventricular Rate:  69 PR Interval:  158 QRS Duration: 102 QT Interval:  438 QTC Calculation: 469 R Axis:   30 Text Interpretation:  Sinus rhythm Borderline low voltage, extremity  leads  No significant change since last tracing Confirmed by [facility] MD, Barbara CowerJASON  [phone] on 04/24/2015 8:55:05 PM      MDM   Final diagnoses:  Fall   Mechanical fall at home. Hit back of head. Has right hip/pelvis pain now. Difficulty ambulating 2/2 discomfort. Right leg slightly shorter than left but not abnormally rotated. Slight pain with lateral rotation of her leg. No significant pain with axial loading. Will XR for evaluation.  XR negative, CT done to eval for fracture as she still had significant pain and couldn't walk, this shwoed compression fracture in back and no other fractures. After 3 doses of pain meds, still not able to ambulate, d/w NSG, who recommended outpatient follow up and application of a brace, however with patients high level of pain with walking, felt observation admission warranted for pain control, PT and further managemetn.     Marily MemosJason Arsenio Schnorr, MD 04/28/15 2230

## 2015-04-24 NOTE — ED Notes (Signed)
Dr Clayborne DanaMesner at bedside updating pt and family,

## 2015-04-24 NOTE — ED Notes (Signed)
Dr Mesner at bedside,  

## 2015-04-25 ENCOUNTER — Encounter (HOSPITAL_COMMUNITY): Payer: Self-pay | Admitting: *Deleted

## 2015-04-25 DIAGNOSIS — Y92009 Unspecified place in unspecified non-institutional (private) residence as the place of occurrence of the external cause: Secondary | ICD-10-CM

## 2015-04-25 DIAGNOSIS — T148 Other injury of unspecified body region: Secondary | ICD-10-CM | POA: Diagnosis not present

## 2015-04-25 DIAGNOSIS — I10 Essential (primary) hypertension: Secondary | ICD-10-CM | POA: Diagnosis not present

## 2015-04-25 DIAGNOSIS — W19XXXA Unspecified fall, initial encounter: Secondary | ICD-10-CM

## 2015-04-25 DIAGNOSIS — S79911A Unspecified injury of right hip, initial encounter: Secondary | ICD-10-CM | POA: Diagnosis not present

## 2015-04-25 DIAGNOSIS — M545 Low back pain, unspecified: Secondary | ICD-10-CM | POA: Diagnosis present

## 2015-04-25 DIAGNOSIS — M4120 Other idiopathic scoliosis, site unspecified: Secondary | ICD-10-CM

## 2015-04-25 DIAGNOSIS — IMO0002 Reserved for concepts with insufficient information to code with codable children: Secondary | ICD-10-CM

## 2015-04-25 DIAGNOSIS — W19XXXD Unspecified fall, subsequent encounter: Secondary | ICD-10-CM

## 2015-04-25 LAB — URINALYSIS, ROUTINE W REFLEX MICROSCOPIC
Bilirubin Urine: NEGATIVE
Glucose, UA: NEGATIVE mg/dL
HGB URINE DIPSTICK: NEGATIVE
KETONES UR: NEGATIVE mg/dL
Nitrite: NEGATIVE
PROTEIN: NEGATIVE mg/dL
Specific Gravity, Urine: 1.015 (ref 1.005–1.030)
pH: 7 (ref 5.0–8.0)

## 2015-04-25 LAB — URINE MICROSCOPIC-ADD ON: RBC / HPF: NONE SEEN RBC/hpf (ref 0–5)

## 2015-04-25 MED ORDER — ADULT MULTIVITAMIN W/MINERALS CH
1.0000 | ORAL_TABLET | Freq: Every morning | ORAL | Status: DC
  Administered 2015-04-25 – 2015-04-26 (×2): 1 via ORAL
  Filled 2015-04-25 (×2): qty 1

## 2015-04-25 MED ORDER — BUPROPION HCL ER (XL) 300 MG PO TB24
300.0000 mg | ORAL_TABLET | Freq: Every day | ORAL | Status: DC
  Administered 2015-04-25 – 2015-04-26 (×2): 300 mg via ORAL
  Filled 2015-04-25 (×4): qty 1

## 2015-04-25 MED ORDER — SODIUM CHLORIDE 0.9 % IJ SOLN: 3.0000 mL | INTRAMUSCULAR | Status: DC | PRN

## 2015-04-25 MED ORDER — SODIUM CHLORIDE 0.9 % IV SOLN: 250.0000 mL | INTRAVENOUS | Status: DC | PRN

## 2015-04-25 MED ORDER — CRANBERRY-CHOLECALCIFEROL 4200-500 MG-UNIT PO CAPS: 1.0000 | ORAL_CAPSULE | Freq: Every morning | ORAL | Status: DC

## 2015-04-25 MED ORDER — POTASSIUM 99 MG PO TABS: 1.0000 | ORAL_TABLET | Freq: Every morning | ORAL | Status: DC

## 2015-04-25 MED ORDER — BUSPIRONE HCL 5 MG PO TABS
15.0000 mg | ORAL_TABLET | Freq: Two times a day (BID) | ORAL | Status: DC
  Administered 2015-04-25 – 2015-04-26 (×3): 15 mg via ORAL
  Filled 2015-04-25 (×3): qty 3

## 2015-04-25 MED ORDER — OXYCODONE HCL 5 MG PO TABS
5.0000 mg | ORAL_TABLET | ORAL | Status: DC | PRN
  Administered 2015-04-25 – 2015-04-26 (×2): 5 mg via ORAL
  Filled 2015-04-25 (×3): qty 1

## 2015-04-25 MED ORDER — FENTANYL 50 MCG/HR TD PT72
50.0000 ug | MEDICATED_PATCH | TRANSDERMAL | Status: DC
  Administered 2015-04-25: 50 ug via TRANSDERMAL
  Filled 2015-04-25: qty 1

## 2015-04-25 MED ORDER — DOCUSATE SODIUM 100 MG PO CAPS
100.0000 mg | ORAL_CAPSULE | Freq: Two times a day (BID) | ORAL | Status: DC
  Administered 2015-04-25 – 2015-04-26 (×3): 100 mg via ORAL
  Filled 2015-04-25 (×3): qty 1

## 2015-04-25 MED ORDER — ALPRAZOLAM 0.25 MG PO TABS
0.2500 mg | ORAL_TABLET | Freq: Every morning | ORAL | Status: DC
  Administered 2015-04-25 – 2015-04-26 (×2): 0.25 mg via ORAL
  Filled 2015-04-25 (×2): qty 1

## 2015-04-25 MED ORDER — ALPRAZOLAM 1 MG PO TABS
1.0000 mg | ORAL_TABLET | Freq: Every day | ORAL | Status: DC
  Administered 2015-04-25: 1 mg via ORAL
  Filled 2015-04-25: qty 1

## 2015-04-25 MED ORDER — VITAMIN C 500 MG PO TABS
1000.0000 mg | ORAL_TABLET | Freq: Every morning | ORAL | Status: DC
  Administered 2015-04-25 – 2015-04-26 (×2): 1000 mg via ORAL
  Filled 2015-04-25 (×2): qty 2

## 2015-04-25 MED ORDER — MAGNESIUM OXIDE 400 (241.3 MG) MG PO TABS
400.0000 mg | ORAL_TABLET | Freq: Every morning | ORAL | Status: DC
  Administered 2015-04-25 – 2015-04-26 (×2): 400 mg via ORAL
  Filled 2015-04-25 (×2): qty 1

## 2015-04-25 MED ORDER — HYDROMORPHONE HCL 1 MG/ML IJ SOLN
1.0000 mg | INTRAMUSCULAR | Status: DC | PRN
Start: 2015-04-25 — End: 2015-04-26
  Administered 2015-04-25: 1 mg via INTRAVENOUS
  Filled 2015-04-25 (×2): qty 1

## 2015-04-25 MED ORDER — HYDROCHLOROTHIAZIDE 25 MG PO TABS
25.0000 mg | ORAL_TABLET | Freq: Every day | ORAL | Status: DC
  Administered 2015-04-25 – 2015-04-26 (×2): 25 mg via ORAL
  Filled 2015-04-25 (×2): qty 1

## 2015-04-25 MED ORDER — DEXTROSE 5 % IV SOLN
1.0000 g | Freq: Once | INTRAVENOUS | Status: AC
  Administered 2015-04-25: 1 g via INTRAVENOUS
  Filled 2015-04-25: qty 10

## 2015-04-25 MED ORDER — ALPRAZOLAM 0.25 MG PO TABS: 0.2500 mg | ORAL_TABLET | Freq: Two times a day (BID) | ORAL | Status: DC

## 2015-04-25 MED ORDER — CHOLECALCIFEROL 10 MCG (400 UNIT) PO TABS
400.0000 [IU] | ORAL_TABLET | Freq: Every day | ORAL | Status: DC
  Administered 2015-04-25 – 2015-04-26 (×2): 400 [IU] via ORAL
  Filled 2015-04-25 (×2): qty 1

## 2015-04-25 MED ORDER — SODIUM CHLORIDE 0.9 % IJ SOLN
3.0000 mL | Freq: Two times a day (BID) | INTRAMUSCULAR | Status: DC
  Administered 2015-04-25 – 2015-04-26 (×3): 3 mL via INTRAVENOUS

## 2015-04-25 MED ORDER — POLYETHYLENE GLYCOL 3350 17 G PO PACK
17.0000 g | PACK | Freq: Every day | ORAL | Status: DC
  Administered 2015-04-25 – 2015-04-26 (×2): 17 g via ORAL
  Filled 2015-04-25 (×2): qty 1

## 2015-04-25 MED ORDER — LISINOPRIL 10 MG PO TABS
20.0000 mg | ORAL_TABLET | Freq: Every morning | ORAL | Status: DC
Start: 2015-04-25 — End: 2015-04-26
  Administered 2015-04-25 – 2015-04-26 (×2): 20 mg via ORAL
  Filled 2015-04-25 (×2): qty 2

## 2015-04-25 MED ORDER — POLYETHYLENE GLYCOL 3350 17 GM/SCOOP PO POWD
17.0000 g | Freq: Every day | ORAL | Status: DC
  Filled 2015-04-25: qty 255

## 2015-04-25 NOTE — Care Management Obs Status (Signed)
MEDICARE OBSERVATION STATUS NOTIFICATION   Patient Details  Name: Kelsey Luna MRN: [id number] Date of Birth: 09-30-1926   Medicare Observation Status Notification Given:  Yes    Cheryl FlashBlackwell, Anays Detore Crowder, RN 04/25/2015, 11:51 AM

## 2015-04-25 NOTE — ED Notes (Signed)
Pt pulse ox 90% on RA, pt placed on oxygen at 2 lpm via [place] with increase of pulse ox to 97%,

## 2015-04-25 NOTE — Care Management Note (Signed)
Case Management Note  Patient Details  Name: Kelsey Luna MRN: [id number] Date of Birth: 1926-07-09  Subjective/Objective:                  Pt admitted from home with falls/compression fx/back pain. Pt lives alone during the day and stays with daughter at night. Pt has a walker for home use.  Action/Plan: PT recommends HH PT, hospital bed, and BSC at discharge. Pt unable to answer questions at this time. [facility] for [facility]. Will continue to follow for discharge planning needs.  Expected Discharge Date:                  Expected Discharge Plan:  Home w Home Health Services  In-House Referral:  NA  Discharge planning Services  CM Consult  Post Acute Care Choice:  Durable Medical Equipment, Home Health Choice offered to:  Adult Children  DME Arranged:  Bedside commode, Hospital bed DME Agency:  [facility]  HH Arranged:  PT HH Agency:  [facility]  Status of Service:  Completed, signed off  Medicare Important Message Given:    Date Medicare IM Given:    Medicare IM give by:    Date Additional Medicare IM Given:    Additional Medicare Important Message give by:     If discussed at Long Length of Stay Meetings, dates discussed:    Additional Comments:  Kelsey Luna, Kelsey Yearwood Crowder, RN 04/25/2015, 12:36 PM

## 2015-04-25 NOTE — ED Notes (Signed)
DAUGHTER CONTACT NUMBER [phone]

## 2015-04-25 NOTE — Clinical Social Work Note (Signed)
CSW received consult for possible SNF. After PT evaluation, plan is for return home with home health. Pt plans to stay with her daughter. CSW will sign off, but can be reconsulted if needed.   Derenda FennelKara Iaan Oregel, LCSW [phone]

## 2015-04-25 NOTE — Evaluation (Signed)
Physical Therapy Evaluation Patient Details Name: Kelsey FaesDorothy A Luckadoo MRN: [id number] DOB: 1926-06-07 Today's Date: 04/25/2015   History of Present Illness  81 yo female walks with a walker, was trying to put a coat on when she lost her balance and fell to the ground hurting her lower back. No LOC. No recent illnesses. Pt denies any Urinary symptoms. Back pain better with iv morphine in the ED, but when tried to ambulate could not. Pt referred for admission for new compression fx and pain control.  She lives alone next door to her daughter and actually spends most of her time at her daughter's home.  Family reports many falls in the past and pt has chronic back pain due to a severe scoliosis and generalized OA.  She ambulates with a walker at baseline.  Clinical Impression   Pt was seen for evaluation/tx.  She was alert and oriented, very cooperative.  Family present.  She is found to have significant generalized weakness with the RLE weaker than the left.  She has no significant pain at rest.  She required total assist to transfer to EOB and mod assist to stand with a walker.  She was able to ambulate 5912' x2  With a walker and good stability.  She would be very appropriate for SNF at d/c but because of insurance constraints, pt will plan to discharge to her daughter's home.  She will need a hospital bed and BSC as well as HHPT and CNA service.  Family understands all of the above.    Follow Up Recommendations Home health PT    Equipment Recommendations  Hospital bed;3in1 (PT)    Recommendations for Other Services       Precautions / Restrictions Precautions Precautions: Fall Required Braces or Orthoses:  (pt has a severe scoliosis and would not be able to tolerate a CASH brace) Restrictions Weight Bearing Restrictions: No      Mobility  Bed Mobility Overal bed mobility: Needs Assistance Bed Mobility: Supine to Sit;Sit to Supine     Supine to sit: Total assist;HOB elevated Sit to  supine: Total assist      Transfers Overall transfer level: Needs assistance Equipment used: Rolling walker (2 wheeled) Transfers: Sit to/from Stand Sit to Stand: Mod assist         General transfer comment: instructed in staying closer to the walker  Ambulation/Gait Ambulation/Gait assistance: Min assist Ambulation Distance (Feet): 12 Feet (x 2 laps) Assistive device: Rolling walker (2 wheeled) Gait Pattern/deviations: Trunk flexed;WFL(Within Functional Limits)   Gait velocity interpretation: <1.8 ft/sec, indicative of risk for recurrent falls    Stairs            Wheelchair Mobility    Modified Rankin (Stroke Patients Only)       Balance Overall balance assessment: Needs assistance Sitting-balance support: No upper extremity supported;Feet supported Sitting balance-Leahy Scale: Fair Sitting balance - Comments: tends to fall backward with any challenge to balance   Standing balance support: Bilateral upper extremity supported;During functional activity Standing balance-Leahy Scale: Fair                               Pertinent Vitals/Pain Pain Assessment: No/denies pain    Home Living Family/patient expects to be discharged to:: Private residence Living Arrangements: Children Available Help at Discharge: Family;Available 24 hours/day Type of Home: House Home Access: Stairs to enter Entrance Stairs-Rails: Right Entrance Stairs-Number of Steps: 5-  I have recommended  that a ramp be built...son in law states that they had built a ramp but pt did not like it so they removed it Home Layout: One level Home Equipment: Walker - 2 wheels;Shower seat;Wheelchair - manual      Prior Function Level of Independence: Independent with assistive device(s)         Comments: ambulates with a walker     Hand Dominance   Dominant Hand: Right    Extremity/Trunk Assessment   Upper Extremity Assessment: Generalized weakness           Lower  Extremity Assessment: Generalized weakness (RLE weaker than LLE generally 2/5)      Cervical / Trunk Assessment: Other exceptions  Communication   Communication: HOH  Cognition Arousal/Alertness: Awake/alert Behavior During Therapy: WFL for tasks assessed/performed Overall Cognitive Status: Within Functional Limits for tasks assessed                      General Comments      Exercises        Assessment/Plan    PT Assessment Patient needs continued PT services  PT Diagnosis Difficulty walking;Generalized weakness;Acute pain   PT Problem List Decreased strength;Decreased activity tolerance;Decreased balance;Decreased mobility;Pain  PT Treatment Interventions DME instruction;Gait training;Functional mobility training;Therapeutic exercise   PT Goals (Current goals can be found in the Care Plan section) Acute Rehab PT Goals Patient Stated Goal: none stated PT Goal Formulation: With patient/family Time For Goal Achievement: 05/09/15 Potential to Achieve Goals: Good    Frequency 7X/week   Barriers to discharge Inaccessible home environment family is to have a ramp installed    Co-evaluation               End of Session Equipment Utilized During Treatment: Gait belt Activity Tolerance: Patient tolerated treatment well;No increased pain;Patient limited by fatigue Patient left: in bed;with call bell/phone within reach;with bed alarm set;with family/visitor present Nurse Communication: Mobility status    Functional Assessment Tool Used: clinical judgement Functional Limitation: Mobility: Walking and moving around Mobility: Walking and Moving Around Current Status (A2130): At least 80 percent but less than 100 percent impaired, limited or restricted Mobility: Walking and Moving Around Goal Status [phone]): At least 60 percent but less than 80 percent impaired, limited or restricted    Time: 1000-1056 PT Time Calculation (min) (ACUTE ONLY): 56 min   Charges:    PT Evaluation $Initial PT Evaluation Tier I: 1 Procedure PT Treatments $Gait Training: 8-22 mins $Self Care/Home Management: 8-22   PT G Codes:   PT G-Codes **NOT FOR INPATIENT CLASS** Functional Assessment Tool Used: clinical judgement Functional Limitation: Mobility: Walking and moving around Mobility: Walking and Moving Around Current Status (I6962): At least 80 percent but less than 100 percent impaired, limited or restricted Mobility: Walking and Moving Around Goal Status [phone]): At least 60 percent but less than 80 percent impaired, limited or restricted    Konrad Penta  PT 04/25/2015, 11:14 AM [phone]

## 2015-04-25 NOTE — H&P (Signed)
PCP:   Kathlee Nations, MD   Chief Complaint:  Fall, back pain  HPI: 81 yo female walks with a walker, was trying to put a coat on when she lost her balance and fell to the ground hurting her lower back.  No LOC.  No recent illnesses.  Pt denies any Urinary symptoms.  Back pain better with iv morphine in the ED, but when tried to ambulate could not.  Pt referred for admission for new compression fx and pain control.  Review of Systems:  Positive and negative as per HPI otherwise all other systems are negative  Past Medical History: Past Medical History  Diagnosis Date  . CAD (coronary artery disease)     DES LAD 6/03, LVEF 67%  . Hyperlipidemia   . Hypertension   . GERD (gastroesophageal reflux disease)   . Hiatal hernia   . Anxiety   . Depression    Past Surgical History  Procedure Laterality Date  . Cataracts    . Nasal sinus surgery    . Tonsilectomy, adenoidectomy, bilateral myringotomy and tubes    . Total abdominal hysterectomy    . Hemorrhoid surgery    . Back surgery      Medications: Prior to Admission medications   Medication Sig Start Date End Date Taking? Authorizing Provider  ALPRAZolam Prudy Feeler) 0.5 MG tablet Take 0.25-1 mg by mouth 2 (two) times daily. Half tablet in the morning and two tabs at bedtime   Yes Historical Provider, MD  Ascorbic Acid (VITAMIN C) 1000 MG tablet Take 1,000 mg by mouth every morning.   Yes Historical Provider, MD  buPROPion (WELLBUTRIN XL) 300 MG 24 hr tablet Take 300 mg by mouth daily.   Yes Historical Provider, MD  busPIRone (BUSPAR) 15 MG tablet Take 15 mg by mouth 2 (two) times daily.   Yes Historical Provider, MD  Cranberry-Cholecalciferol (SUPER CRANBERRY/VITAMIN D3) 4200-500 MG-UNIT CAPS Take 1 capsule by mouth every morning.   Yes Historical Provider, MD  hydrochlorothiazide (HYDRODIURIL) 25 MG tablet Take 25 mg by mouth daily.   Yes Historical Provider, MD  lisinopril (PRINIVIL,ZESTRIL) 20 MG tablet Take 20 mg by mouth every  morning.   Yes Historical Provider, MD  magnesium oxide (MAG-OX) 400 MG tablet Take 400 mg by mouth every morning.   Yes Historical Provider, MD  Multiple Vitamin (MULTIVITAMIN WITH MINERALS) TABS tablet Take 1 tablet by mouth every morning.   Yes Historical Provider, MD  omeprazole (PRILOSEC) 20 MG capsule Take 20 mg by mouth daily.     Yes Historical Provider, MD  polyethylene glycol (MIRALAX) powder Take 17 g by mouth daily.    Yes Historical Provider, MD  Potassium 99 MG TABS Take 1 tablet by mouth every morning.    Yes Historical Provider, MD    Allergies:   Allergies  Allergen Reactions  . Pseudoephedrine Other (See Comments)    Pt states she lost her memory for a day when she took Sudafed.     Social History:  reports that she has never smoked. She has never used smokeless tobacco. She reports that she does not drink alcohol or use illicit drugs.  Family History: Family History  Problem Relation Age of Onset  . Coronary artery disease Father   . Cancer Father   . Diabetes Father   . Diabetes Mother   . Diabetes Sister     Physical Exam: Filed Vitals:   04/25/15 0027 04/25/15 0100 04/25/15 0130 04/25/15 0224  BP:  129/37 144/49 122/44  Pulse:   70 67  Temp:    98.1 F (36.7 C)  TempSrc:    Oral  Resp:  SpO2: 97%  95% 94%   General appearance: alert, cooperative and no distress Head: Normocephalic, without obvious abnormality, atraumatic Eyes: negative Nose: Nares normal. Septum midline. Mucosa normal. No drainage or sinus tenderness. Neck: no JVD and supple, symmetrical, trachea midline Lungs: clear to auscultation bilaterally Heart: regular rate and rhythm, S1, S2 normal, no murmur, click, rub or gallop Abdomen: soft, non-tender; bowel sounds normal; no masses,  no organomegaly Extremities: extremities normal, atraumatic, no cyanosis or edema Pulses: 2+ and symmetric Skin: Skin color, texture, turgor normal. No rashes or lesions Neurologic: Grossly  normal    Labs on Admission:   Recent Labs  04/24/15 2059  NA 136  K 3.5  CL 102  CO2 23  GLUCOSE 101*  BUN 19  CREATININE 0.71  CALCIUM 10.2    Recent Labs  04/24/15 2059  AST 27  ALT 24  ALKPHOS 63  BILITOT 0.8  PROT 6.8  ALBUMIN 3.9    Recent Labs  04/24/15 2059  WBC 11.2*  NEUTROABS 9.1*  HGB 13.1  HCT 38.1  MCV 91.1  PLT 299    Radiological Exams on Admission: Dg Chest 1 View  04/24/2015  CLINICAL DATA:  Status post fall EXAM: CHEST 1 VIEW COMPARISON:  None. FINDINGS: Lungs are clear.  No pleural effusion or pneumothorax. Cardiomegaly. IMPRESSION: No evidence of acute cardiopulmonary disease. Electronically Signed   By: Charline Bills M.D.   On: 04/24/2015 21:36   Ct Head Wo Contrast  04/24/2015  CLINICAL DATA:  Fall, head injury, posterior headache EXAM: CT HEAD WITHOUT CONTRAST TECHNIQUE: Contiguous axial images were obtained from the base of the skull through the vertex without intravenous contrast. COMPARISON:  None. FINDINGS: No evidence of parenchymal hemorrhage or extra-axial fluid collection. No mass lesion, mass effect, or midline shift. No CT evidence of acute infarction. Extensive subcortical white matter and periventricular small vessel ischemic changes. Intracranial atherosclerosis. Global cortical and central atrophy.  Secondary ventriculomegaly. The visualized paranasal sinuses are essentially clear. The mastoid air cells are unopacified. No evidence of calvarial fracture. IMPRESSION: No evidence of acute intracranial abnormality. Atrophy with secondary ventriculomegaly. Extensive small vessel ischemic changes. Electronically Signed   By: Charline Bills M.D.   On: 04/24/2015 21:41   Ct Thoracic Spine Wo Contrast  04/24/2015  CLINICAL DATA:  Status post fall, with mid back pain. Initial encounter. EXAM: CT THORACIC AND LUMBAR SPINE WITHOUT CONTRAST TECHNIQUE: Multidetector CT imaging of the thoracic and lumbar spine was performed without  contrast. Multiplanar CT image reconstructions were also generated. COMPARISON:  None. FINDINGS: CT THORACIC SPINE FINDINGS There appears to be a mild acute compression fracture involving vertebral body T8, with approximately 20% loss of height. Slight cortical irregularity is noted along the anterior inferior aspect of the vertebral body. There is no evidence of retropulsion or extension to the posterior elements. No additional fractures are seen. Visualized intervertebral disc spaces are grossly unremarkable, aside from minimal vacuum phenomenon along the lower thoracic spine. The visualized bony foramina are grossly unremarkable in appearance. Mild bibasilar atelectasis is noted, with underlying scarring. No significant pleural effusion or pneumothorax is seen, though the lungs are incompletely imaged. Diffuse coronary artery calcifications are seen. The heart is mildly enlarged. CT LUMBAR SPINE FINDINGS There is no evidence of fracture or subluxation along the lumbar spine. Intervertebral disc space narrowing is noted at  L1-L2 and L2-L3, and vacuum phenomenon is noted at L2-L3 and L3-L4. Mild endplate sclerotic change is noted at L2-L3. There is mild left convex lumbar scoliosis. Diffuse calcification is noted along the abdominal aorta and its branches. A small hiatal hernia is noted. There is mild diffuse atrophy of the paraspinal musculature. IMPRESSION: 1. Mild acute compression fracture involving vertebral body T8, with approximately 20% loss of height and slight cortical irregularity along the anterior inferior aspect of the vertebral body. No evidence of retropulsion or extension to the posterior elements. 2. Mild degenerative change along the lower thoracic and lumbar spine, and mild left convex lumbar scoliosis. 3. Mild bibasilar atelectasis, with underlying scarring. 4. Mild cardiomegaly and diffuse coronary artery calcifications. 5. Diffuse calcification along the abdominal aorta and its branches. 6.  Small hiatal hernia noted. 7. Mild diffuse atrophy of the paraspinal musculature. Electronically Signed   By: Roanna Raider M.D.   On: 04/24/2015 23:07   Ct Lumbar Spine Wo Contrast  04/24/2015  CLINICAL DATA:  Status post fall, with mid back pain. Initial encounter. EXAM: CT THORACIC AND LUMBAR SPINE WITHOUT CONTRAST TECHNIQUE: Multidetector CT imaging of the thoracic and lumbar spine was performed without contrast. Multiplanar CT image reconstructions were also generated. COMPARISON:  None. FINDINGS: CT THORACIC SPINE FINDINGS There appears to be a mild acute compression fracture involving vertebral body T8, with approximately 20% loss of height. Slight cortical irregularity is noted along the anterior inferior aspect of the vertebral body. There is no evidence of retropulsion or extension to the posterior elements. No additional fractures are seen. Visualized intervertebral disc spaces are grossly unremarkable, aside from minimal vacuum phenomenon along the lower thoracic spine. The visualized bony foramina are grossly unremarkable in appearance. Mild bibasilar atelectasis is noted, with underlying scarring. No significant pleural effusion or pneumothorax is seen, though the lungs are incompletely imaged. Diffuse coronary artery calcifications are seen. The heart is mildly enlarged. CT LUMBAR SPINE FINDINGS There is no evidence of fracture or subluxation along the lumbar spine. Intervertebral disc space narrowing is noted at L1-L2 and L2-L3, and vacuum phenomenon is noted at L2-L3 and L3-L4. Mild endplate sclerotic change is noted at L2-L3. There is mild left convex lumbar scoliosis. Diffuse calcification is noted along the abdominal aorta and its branches. A small hiatal hernia is noted. There is mild diffuse atrophy of the paraspinal musculature. IMPRESSION: 1. Mild acute compression fracture involving vertebral body T8, with approximately 20% loss of height and slight cortical irregularity along the  anterior inferior aspect of the vertebral body. No evidence of retropulsion or extension to the posterior elements. 2. Mild degenerative change along the lower thoracic and lumbar spine, and mild left convex lumbar scoliosis. 3. Mild bibasilar atelectasis, with underlying scarring. 4. Mild cardiomegaly and diffuse coronary artery calcifications. 5. Diffuse calcification along the abdominal aorta and its branches. 6. Small hiatal hernia noted. 7. Mild diffuse atrophy of the paraspinal musculature. Electronically Signed   By: Roanna Raider M.D.   On: 04/24/2015 23:07   Ct Pelvis Wo Contrast  04/24/2015  CLINICAL DATA:  Fall. Right hip and pelvic pain. Negative x-rays. Initial encounter. EXAM: CT PELVIS WITHOUT CONTRAST TECHNIQUE: Multidetector CT imaging of the pelvis was performed following the standard protocol without intravenous contrast. COMPARISON:  None. FINDINGS: No evidence of acute fracture or dislocation involving either hip joint. No evidence of acute pelvic fracture or pelvic joint diastasis. Mild bilateral hip osteoarthritis is seen. Mild pubic symphysis degenerative changes also noted. Generalized osteopenia demonstrated. Small  subchondral geodes noted in the left femoral head. No evidence of pelvic hematoma or mass. Sigmoid diverticulosis is demonstrated, without evidence of diverticulitis. Prior hysterectomy also noted. IMPRESSION: No evidence of acute hip or pelvic fracture. Generalized osteopenia. Mild bilateral hip osteoarthritis and pubic symphysis degenerative changes. Electronically Signed   By: Myles RosenthalJohn  Stahl M.D.   On: 04/24/2015 23:01   Dg Knee Complete 4 Views Right  04/24/2015  CLINICAL DATA:  Right knee pain following fall, initial encounter EXAM: RIGHT KNEE - COMPLETE 4+ VIEW COMPARISON:  None. FINDINGS: No acute fracture is identified. Mild degenerative changes are noted in the medial joint space. Vascular calcifications are seen. No gross soft tissue abnormality is noted.  IMPRESSION: No acute abnormality seen. Electronically Signed   By: Alcide CleverMark  Lukens M.D.   On: 04/24/2015 21:36   Dg Hip Unilat With Pelvis 2-3 Views Right  04/24/2015  CLINICAL DATA:  Recent fall with right hip pain, initial encounter EXAM: DG HIP (WITH OR WITHOUT PELVIS) 2-3V RIGHT COMPARISON:  None. FINDINGS: There is no evidence of hip fracture or dislocation. There is no evidence of arthropathy or other focal bone abnormality. IMPRESSION: No acute abnormality seen. Electronically Signed   By: Alcide CleverMark  Lukens M.D.   On: 04/24/2015 21:35   Old chart reviewed  Assessment/Plan  81 yo female with mechanical fall, acute back pain and new T8 compression fracture  Principal Problem:   Acute low back pain- due to compression fx.  Neurosurgery called, recommended back brace and outpt follow up with them per dr Erin Hearingmessner.  Place on oral oxycondone.  Prn dilaudid for severe pain.  Start on colace.  PT evaluation. May need rehab.  Active Problems:   Essential hypertension, benign   SCOLIOSIS, LUMBAR SPINE   Compression fracture   Fall at home  obs on medical.  Referral for possible short term rehab, family not available to discuss at this time.  Gail Vendetti A 04/25/2015, 2:36 AM

## 2015-04-25 NOTE — ED Notes (Signed)
Pt repositioned for comfort, warm blanket provided,

## 2015-04-25 NOTE — Progress Notes (Signed)
Kelsey Luna:[id number] DOB: 1926-09-20 DOA: 04/24/2015 PCP: Kathlee Nations, MD  Assessment/Plan: 1. Acute low back pain, secondary to thoracic compression fx. Patient had a mechanical fall at home. Case was discussed with neurosurgery by ER and recommendations were for back brace and physical therapy. Today patient scoliosis, she would not be able to fit a back brace. She had been seen by physical therapy who recommends home health physical therapy. She'll need a wheelchair, hospital bed, walker to be set up at home prior to discharge. She will also follow with neurosurgery as an outpatient. Currently arrangements are being made to have equipment delivered prior to discharge. Anticipate discharge home tomorrow once equipment has been delivered. 2. Hypertension. Stable 3. Anxiety. Continue Xanax.   Code Status: Full DVT prophylaxis: SCD Family Communication: Discussed with daughter at the bedside Disposition Plan: Discharge home in a.m.   Consultants:    Procedures:    Antibiotics:    HPI/Subjective: Continues to have significant back pain.  Objective: Filed Vitals:   04/25/15 0224 04/25/15 0316  BP: 122/44 134/62  Pulse: 67 67  Temp: 98.1 F (36.7 C) 98 F (36.7 C)  Resp: 16 18    Intake/Output Summary (Last 24 hours) at 04/25/15 0956 Last data filed at 04/25/15 0953  Gross per 24 hour  Intake    240 ml  Output      0 ml  Net    240 ml   Filed Weights   04/25/15 0316  Weight: 59.5 kg (131 lb 2.8 oz)    Exam:  General: NAD, looks comfortable Cardiovascular: RRR, S1, S2  Respiratory: clear bilaterally, No wheezing, rales or rhonchi Abdomen: soft, non tender, no distention , bowel sounds normal Musculoskeletal: No edema b/l  Data Reviewed: Basic Metabolic Panel:  Recent Labs Lab 04/24/15 2059  NA 136  K 3.5  CL 102  CO2 23  GLUCOSE 101*  BUN 19  CREATININE 0.71  CALCIUM 10.2   Liver Function  Tests:  Recent Labs Lab 04/24/15 2059  AST 27  ALT 24  ALKPHOS 63  BILITOT 0.8  PROT 6.8  ALBUMIN 3.9   No results for input(s): LIPASE, AMYLASE in the last 168 hours. No results for input(s): AMMONIA in the last 168 hours. CBC:  Recent Labs Lab 04/24/15 2059  WBC 11.2*  NEUTROABS 9.1*  HGB 13.1  HCT 38.1  MCV 91.1  PLT 299    Studies: Dg Chest 1 View  04/24/2015  CLINICAL DATA:  Status post fall EXAM: CHEST 1 VIEW COMPARISON:  None. FINDINGS: Lungs are clear.  No pleural effusion or pneumothorax. Cardiomegaly. IMPRESSION: No evidence of acute cardiopulmonary disease. Electronically Signed   By: Charline Bills M.D.   On: 04/24/2015 21:36   Ct Head Wo Contrast  04/24/2015  CLINICAL DATA:  Fall, head injury, posterior headache EXAM: CT HEAD WITHOUT CONTRAST TECHNIQUE: Contiguous axial images were obtained from the base of the skull through the vertex without intravenous contrast. COMPARISON:  None. FINDINGS: No evidence of parenchymal hemorrhage or extra-axial fluid collection. No mass lesion, mass effect, or midline shift. No CT evidence of acute infarction. Extensive subcortical white matter and periventricular small vessel ischemic changes. Intracranial atherosclerosis. Global cortical and central atrophy.  Secondary ventriculomegaly. The visualized paranasal sinuses are essentially clear. The mastoid air cells are unopacified. No evidence of calvarial fracture. IMPRESSION: No evidence of acute intracranial abnormality. Atrophy with secondary ventriculomegaly. Extensive small vessel ischemic changes. Electronically Signed   By: Lurlean Horns  Rito Ehrlich M.D.   On: 04/24/2015 21:41   Ct Thoracic Spine Wo Contrast  04/24/2015  CLINICAL DATA:  Status post fall, with mid back pain. Initial encounter. EXAM: CT THORACIC AND LUMBAR SPINE WITHOUT CONTRAST TECHNIQUE: Multidetector CT imaging of the thoracic and lumbar spine was performed without contrast. Multiplanar CT image reconstructions  were also generated. COMPARISON:  None. FINDINGS: CT THORACIC SPINE FINDINGS There appears to be a mild acute compression fracture involving vertebral body T8, with approximately 20% loss of height. Slight cortical irregularity is noted along the anterior inferior aspect of the vertebral body. There is no evidence of retropulsion or extension to the posterior elements. No additional fractures are seen. Visualized intervertebral disc spaces are grossly unremarkable, aside from minimal vacuum phenomenon along the lower thoracic spine. The visualized bony foramina are grossly unremarkable in appearance. Mild bibasilar atelectasis is noted, with underlying scarring. No significant pleural effusion or pneumothorax is seen, though the lungs are incompletely imaged. Diffuse coronary artery calcifications are seen. The heart is mildly enlarged. CT LUMBAR SPINE FINDINGS There is no evidence of fracture or subluxation along the lumbar spine. Intervertebral disc space narrowing is noted at L1-L2 and L2-L3, and vacuum phenomenon is noted at L2-L3 and L3-L4. Mild endplate sclerotic change is noted at L2-L3. There is mild left convex lumbar scoliosis. Diffuse calcification is noted along the abdominal aorta and its branches. A small hiatal hernia is noted. There is mild diffuse atrophy of the paraspinal musculature. IMPRESSION: 1. Mild acute compression fracture involving vertebral body T8, with approximately 20% loss of height and slight cortical irregularity along the anterior inferior aspect of the vertebral body. No evidence of retropulsion or extension to the posterior elements. 2. Mild degenerative change along the lower thoracic and lumbar spine, and mild left convex lumbar scoliosis. 3. Mild bibasilar atelectasis, with underlying scarring. 4. Mild cardiomegaly and diffuse coronary artery calcifications. 5. Diffuse calcification along the abdominal aorta and its branches. 6. Small hiatal hernia noted. 7. Mild diffuse  atrophy of the paraspinal musculature. Electronically Signed   By: Roanna Raider M.D.   On: 04/24/2015 23:07   Ct Lumbar Spine Wo Contrast  04/24/2015  CLINICAL DATA:  Status post fall, with mid back pain. Initial encounter. EXAM: CT THORACIC AND LUMBAR SPINE WITHOUT CONTRAST TECHNIQUE: Multidetector CT imaging of the thoracic and lumbar spine was performed without contrast. Multiplanar CT image reconstructions were also generated. COMPARISON:  None. FINDINGS: CT THORACIC SPINE FINDINGS There appears to be a mild acute compression fracture involving vertebral body T8, with approximately 20% loss of height. Slight cortical irregularity is noted along the anterior inferior aspect of the vertebral body. There is no evidence of retropulsion or extension to the posterior elements. No additional fractures are seen. Visualized intervertebral disc spaces are grossly unremarkable, aside from minimal vacuum phenomenon along the lower thoracic spine. The visualized bony foramina are grossly unremarkable in appearance. Mild bibasilar atelectasis is noted, with underlying scarring. No significant pleural effusion or pneumothorax is seen, though the lungs are incompletely imaged. Diffuse coronary artery calcifications are seen. The heart is mildly enlarged. CT LUMBAR SPINE FINDINGS There is no evidence of fracture or subluxation along the lumbar spine. Intervertebral disc space narrowing is noted at L1-L2 and L2-L3, and vacuum phenomenon is noted at L2-L3 and L3-L4. Mild endplate sclerotic change is noted at L2-L3. There is mild left convex lumbar scoliosis. Diffuse calcification is noted along the abdominal aorta and its branches. A small hiatal hernia is noted. There is mild diffuse  atrophy of the paraspinal musculature. IMPRESSION: 1. Mild acute compression fracture involving vertebral body T8, with approximately 20% loss of height and slight cortical irregularity along the anterior inferior aspect of the vertebral body.  No evidence of retropulsion or extension to the posterior elements. 2. Mild degenerative change along the lower thoracic and lumbar spine, and mild left convex lumbar scoliosis. 3. Mild bibasilar atelectasis, with underlying scarring. 4. Mild cardiomegaly and diffuse coronary artery calcifications. 5. Diffuse calcification along the abdominal aorta and its branches. 6. Small hiatal hernia noted. 7. Mild diffuse atrophy of the paraspinal musculature. Electronically Signed   By: Roanna RaiderJeffery  Chang M.D.   On: 04/24/2015 23:07   Ct Pelvis Wo Contrast  04/24/2015  CLINICAL DATA:  Fall. Right hip and pelvic pain. Negative x-rays. Initial encounter. EXAM: CT PELVIS WITHOUT CONTRAST TECHNIQUE: Multidetector CT imaging of the pelvis was performed following the standard protocol without intravenous contrast. COMPARISON:  None. FINDINGS: No evidence of acute fracture or dislocation involving either hip joint. No evidence of acute pelvic fracture or pelvic joint diastasis. Mild bilateral hip osteoarthritis is seen. Mild pubic symphysis degenerative changes also noted. Generalized osteopenia demonstrated. Small subchondral geodes noted in the left femoral head. No evidence of pelvic hematoma or mass. Sigmoid diverticulosis is demonstrated, without evidence of diverticulitis. Prior hysterectomy also noted. IMPRESSION: No evidence of acute hip or pelvic fracture. Generalized osteopenia. Mild bilateral hip osteoarthritis and pubic symphysis degenerative changes. Electronically Signed   By: Myles RosenthalJohn  Stahl M.D.   On: 04/24/2015 23:01   Dg Knee Complete 4 Views Right  04/24/2015  CLINICAL DATA:  Right knee pain following fall, initial encounter EXAM: RIGHT KNEE - COMPLETE 4+ VIEW COMPARISON:  None. FINDINGS: No acute fracture is identified. Mild degenerative changes are noted in the medial joint space. Vascular calcifications are seen. No gross soft tissue abnormality is noted. IMPRESSION: No acute abnormality seen. Electronically  Signed   By: Alcide CleverMark  Lukens M.D.   On: 04/24/2015 21:36   Dg Hip Unilat With Pelvis 2-3 Views Right  04/24/2015  CLINICAL DATA:  Recent fall with right hip pain, initial encounter EXAM: DG HIP (WITH OR WITHOUT PELVIS) 2-3V RIGHT COMPARISON:  None. FINDINGS: There is no evidence of hip fracture or dislocation. There is no evidence of arthropathy or other focal bone abnormality. IMPRESSION: No acute abnormality seen. Electronically Signed   By: Alcide CleverMark  Lukens M.D.   On: 04/24/2015 21:35    Scheduled Meds: . ALPRAZolam  0.25 mg Oral q morning - 10a   And  . ALPRAZolam  1 mg Oral QHS  . buPROPion  300 mg Oral Daily  . busPIRone  15 mg Oral BID  . cholecalciferol  400 Units Oral Daily  . docusate sodium  100 mg Oral BID  . hydrochlorothiazide  25 mg Oral Daily  . lisinopril  20 mg Oral q morning - 10a  . magnesium oxide  400 mg Oral q morning - 10a  . multivitamin with minerals  1 tablet Oral q morning - 10a  . polyethylene glycol  17 g Oral Daily  . sodium chloride  3 mL Intravenous Q12H  . vitamin C  1,000 mg Oral q morning - 10a   Continuous Infusions:   Principal Problem:   Acute low back pain Active Problems:   Essential hypertension, benign   SCOLIOSIS, LUMBAR SPINE   Compression fracture   Fall at home    Time spent: 20 minutes     Erick BlinksMemon, Mailin Coglianese, MD  [facility] Pager  [phone]. If 7PM-7AM, please contact night-coverage at [email or web address], password [email or web address] 04/25/2015, 9:56 AM

## 2015-04-26 DIAGNOSIS — S79911A Unspecified injury of right hip, initial encounter: Secondary | ICD-10-CM | POA: Diagnosis not present

## 2015-04-26 DIAGNOSIS — M545 Low back pain: Secondary | ICD-10-CM | POA: Diagnosis not present

## 2015-04-26 MED ORDER — ACETAMINOPHEN 325 MG PO TABS: 325.0000 mg | ORAL_TABLET | Freq: Four times a day (QID) | ORAL | Status: DC | PRN

## 2015-04-26 MED ORDER — OXYCODONE HCL 5 MG PO TABS: 5.0000 mg | ORAL_TABLET | Freq: Four times a day (QID) | ORAL | Status: DC | PRN

## 2015-04-26 MED ORDER — METHOCARBAMOL 500 MG PO TABS: 500.0000 mg | ORAL_TABLET | Freq: Four times a day (QID) | ORAL | Status: DC | PRN

## 2015-04-26 MED ORDER — METHOCARBAMOL 500 MG PO TABS
500.0000 mg | ORAL_TABLET | Freq: Four times a day (QID) | ORAL | Status: DC | PRN
  Administered 2015-04-26: 500 mg via ORAL
  Filled 2015-04-26: qty 1

## 2015-04-26 NOTE — Plan of Care (Signed)
Problem: Safety: Goal: Ability to remain free from injury will improve Outcome: Progressing Pt is still very impulsive, but has remained free of falls this shift.   Problem: Pain Managment: Goal: General experience of comfort will improve Outcome: Progressing Pt complained of pain once last night and received one dose of pain medicine.   Problem: Physical Regulation: Goal: Ability to maintain clinical measurements within normal limits will improve Outcome: Progressing See flowsheet.  Problem: Skin Integrity: Goal: Risk for impaired skin integrity will decrease Outcome: Progressing Pt has red spots under breast.

## 2015-04-26 NOTE — Progress Notes (Signed)
Patient discharged home.  IV removed - WNL.  Reviewed medications and DC instructions.  Instructed to follow up with PCP and neuro/spine in 1 week.  Verbalizes understanding.  No questions at this time.  Stable to DC home, assisted off unit via WC.

## 2015-04-26 NOTE — Care Management Note (Signed)
Case Management Note  Patient Details  Name: Kelsey Luna MRN: [id number] Date of Birth: 19-Jun-1926  Subjective/Objective:                    Action/Plan:   Expected Discharge Date:                  Expected Discharge Plan:  Home w Home Health Services  In-House Referral:  NA  Discharge planning Services  CM Consult  Post Acute Care Choice:  Durable Medical Equipment, Home Health Choice offered to:  Adult Children  DME Arranged:  Bedside commode, Hospital bed DME Agency:  [facility]  HH Arranged:  PT, Nurse's Aide HH Agency:  [facility]  Status of Service:  Completed, signed off  Medicare Important Message Given:    Date Medicare IM Given:    Medicare IM give by:    Date Additional Medicare IM Given:    Additional Medicare Important Message give by:     If discussed at Long Length of Stay Meetings, dates discussed:    Additional Comments: Pt discharged home today with [facility] PT and aide (per pts daughter choice). Alroy BailiffLinda [facility] is aware and will collect the pts information from the chart. HH services to start within 48 hours of discharge. [facility]. Pt and pts nurse aware of discharge arrangements. Arlyss QueenBlackwell, Kelsey Looper Orchidrowder, RN 04/26/2015, 12:38 PM

## 2015-04-26 NOTE — Discharge Instructions (Signed)
Follow with Primary MD Kathlee NationsEASON,PAUL, MD in 7 days   Get CBC, CMP, checked  by Primary MD next visit.    Activity: As tolerated with Full fall precautions use walker/cane & assistance as needed   Disposition Home   Diet: Heart Healthy  , with feeding assistance and aspiration precautions.  For Heart failure patients - Check your Weight same time everyday, if you gain over 2 pounds, or you develop in leg swelling, experience more shortness of breath or chest pain, call your Primary MD immediately. Follow Cardiac Low Salt Diet and 1.5 lit/day fluid restriction.   On your next visit with your primary care physician please Get Medicines reviewed and adjusted.   Please request your Prim.MD to go over all Hospital Tests and Procedure/Radiological results at the follow up, please get all Hospital records sent to your Prim MD by signing hospital release before you go home.   If you experience worsening of your admission symptoms, develop shortness of breath, life threatening emergency, suicidal or homicidal thoughts you must seek medical attention immediately by calling 911 or calling your MD immediately  if symptoms less severe.  You Must read complete instructions/literature along with all the possible adverse reactions/side effects for all the Medicines you take and that have been prescribed to you. Take any new Medicines after you have completely understood and accpet all the possible adverse reactions/side effects.   Do not drive, operating heavy machinery, perform activities at heights, swimming or participation in water activities or provide baby sitting services if your were admitted for syncope or siezures until you have seen by Primary MD or a Neurologist and advised to do so again.  Do not drive when taking Pain medications.    Do not take more than prescribed Pain, Sleep and Anxiety Medications  Special Instructions: If you have smoked or chewed Tobacco  in the last 2 yrs please  stop smoking, stop any regular Alcohol  and or any Recreational drug use.  Wear Seat belts while driving.   Please note  You were cared for by a hospitalist during your hospital stay. If you have any questions about your discharge medications or the care you received while you were in the hospital after you are discharged, you can call the unit and asked to speak with the hospitalist on call if the hospitalist that took care of you is not available. Once you are discharged, your primary care physician will handle any further medical issues. Please note that NO REFILLS for any discharge medications will be authorized once you are discharged, as it is imperative that you return to your primary care physician (or establish a relationship with a primary care physician if you do not have one) for your aftercare needs so that they can reassess your need for medications and monitor your lab values.

## 2015-04-26 NOTE — Discharge Summary (Addendum)
Kelsey Luna, is a 81 y.o. female  DOB [birth year]  MRN [id number].  Admission date:  04/24/2015  Admitting Physician  Haydee Monica, MD  Discharge Date:  04/26/2015   Primary MD  Kathlee Nations, MD  Recommendations for primary care physician for things to follow:  - Patient to follow with neurosurgery regarding thoracic compression fracture   Admission Diagnosis  Fall [W19.XXXA]   Discharge Diagnosis  Fall [W19.XXXA]    Principal Problem:   Acute low back pain Active Problems:   Essential hypertension, benign   SCOLIOSIS, LUMBAR SPINE   Compression fracture   Fall at home      Past Medical History  Diagnosis Date  . CAD (coronary artery disease)     DES LAD 6/03, LVEF 67%  . Hyperlipidemia   . Hypertension   . GERD (gastroesophageal reflux disease)   . Hiatal hernia   . Anxiety   . Depression     Past Surgical History  Procedure Laterality Date  . Cataracts    . Nasal sinus surgery    . Tonsilectomy, adenoidectomy, bilateral myringotomy and tubes    . Total abdominal hysterectomy    . Hemorrhoid surgery    . Back surgery    . Cholecystectomy         History of present illness and  Hospital Course:     Kindly see H&P for history of present illness and admission details, please review complete Labs, Consult reports and Test reports for all details in brief  HPI  from the history and physical done on the day of admission on 04/25/2015   81 yo female walks with a walker, was trying to put a coat on when she lost her balance and fell to the ground hurting her lower back. No LOC. No recent illnesses. Pt denies any Urinary symptoms. Back pain better with iv morphine in the ED, but when tried to ambulate could not. Pt referred for admission for new compression fx and pain control.  Hospital Course  Acute low back pain,  - secondary to thoracic compression fx. Patient had a  mechanical fall at home. Case was discussed with neurosurgery by ER and recommendations were for back brace and physical therapy. Given  patient scoliosis, she would not be able to fit a back brace. She had been seen by physical therapy who recommends home health physical therapy. She'll need a wheelchair, hospital bed, walker to be set up at home prior to discharge. She will also follow with neurosurgery as an outpatient.  Hypertension.  - Stable  Anxiety.  - Continue Xanax.  Discharge Condition: Stable   Follow UP  Follow-up Information    Follow up with [facility].   Contact information:   [address] [place] [address] [phone]       Follow up with Kathlee Nations, MD. Schedule an appointment as soon as possible for a visit in 1 week.   Specialty:  Internal Medicine   Why:  Posthospitalization follow-up   Contact  information:   [address] [place] [address] [phone]       Follow up with [facility]. Schedule an appointment as soon as possible for a visit in 1 week.   Specialty:  Neurosurgery   Why:  thorathic spine fracture   Contact information:   [address] [place] [address] [phone]         Discharge Instructions  and  Discharge Medications         Discharge Instructions    Diet - low sodium heart healthy    Complete by:  As directed      Discharge instructions    Complete by:  As directed   Follow with Primary MD Kathlee Nations, MD in 7 days   Get CBC, CMP,  checked  by Primary MD next visit.    Activity: As tolerated with Full fall precautions use walker/cane & assistance as needed   Disposition Home    Diet: Heart Healthy ** , with feeding assistance and aspiration precautions.  For Heart failure patients - Check your Weight same time everyday, if you gain over 2 pounds, or you develop in leg swelling, experience more shortness of breath or chest  pain, call your Primary MD immediately. Follow Cardiac Low Salt Diet and 1.5 lit/day fluid restriction.   On your next visit with your primary care physician please Get Medicines reviewed and adjusted.   Please request your Prim.MD to go over all Hospital Tests and Procedure/Radiological results at the follow up, please get all Hospital records sent to your Prim MD by signing hospital release before you go home.   If you experience worsening of your admission symptoms, develop shortness of breath, life threatening emergency, suicidal or homicidal thoughts you must seek medical attention immediately by calling 911 or calling your MD immediately  if symptoms less severe.  You Must read complete instructions/literature along with all the possible adverse reactions/side effects for all the Medicines you take and that have been prescribed to you. Take any new Medicines after you have completely understood and accpet all the possible adverse reactions/side effects.   Do not drive, operating heavy machinery, perform activities at heights, swimming or participation in water activities or provide baby sitting services if your were admitted for syncope or siezures until you have seen by Primary MD or a Neurologist and advised to do so again.  Do not drive when taking Pain medications.    Do not take more than prescribed Pain, Sleep and Anxiety Medications  Special Instructions: If you have smoked or chewed Tobacco  in the last 2 yrs please stop smoking, stop any regular Alcohol  and or any Recreational drug use.  Wear Seat belts while driving.   Please note  You were cared for by a hospitalist during your hospital stay. If you have any questions about your discharge medications or the care you received while you were in the hospital after you are discharged, you can call the unit and asked to speak with the hospitalist on call if the hospitalist that took care of you is not available. Once you are  discharged, your primary care physician will handle any further medical issues. Please note that NO REFILLS for any discharge medications will be authorized once you are discharged, as it is imperative that you return to your primary care physician (or establish a relationship with a primary care physician if you do not have one) for your aftercare needs so  that they can reassess your need for medications and monitor your lab values.     Increase activity slowly    Complete by:  As directed             Medication List    TAKE these medications        acetaminophen 325 MG tablet  Commonly known as:  TYLENOL  Take 1 tablet (325 mg total) by mouth every 6 (six) hours as needed for mild pain.     ALPRAZolam 0.5 MG tablet  Commonly known as:  XANAX  Take 0.25-1 mg by mouth 2 (two) times daily. Half tablet in the morning and two tabs at bedtime     buPROPion 300 MG 24 hr tablet  Commonly known as:  WELLBUTRIN XL  Take 300 mg by mouth daily.     busPIRone 15 MG tablet  Commonly known as:  BUSPAR  Take 15 mg by mouth 2 (two) times daily.     hydrochlorothiazide 25 MG tablet  Commonly known as:  HYDRODIURIL  Take 25 mg by mouth daily.     lisinopril 20 MG tablet  Commonly known as:  PRINIVIL,ZESTRIL  Take 20 mg by mouth every morning.     magnesium oxide 400 MG tablet  Commonly known as:  MAG-OX  Take 400 mg by mouth every morning.     MIRALAX powder  Generic drug:  polyethylene glycol powder  Take 17 g by mouth daily.     multivitamin with minerals Tabs tablet  Take 1 tablet by mouth every morning.     oxyCODONE 5 MG immediate release tablet  Commonly known as:  Oxy IR/ROXICODONE  Take 1 tablet (5 mg total) by mouth every 6 (six) hours as needed for severe pain.     Potassium 99 MG Tabs  Take 1 tablet by mouth every morning.     PRILOSEC 20 MG capsule  Generic drug:  omeprazole  Take 20 mg by mouth daily.     SUPER CRANBERRY/VITAMIN D3 4200-500 MG-UNIT Caps  Generic  drug:  Cranberry-Cholecalciferol  Take 1 capsule by mouth every morning.     vitamin C 1000 MG tablet  Take 1,000 mg by mouth every morning.          Diet and Activity recommendation: See Discharge Instructions above   Consults obtained -  none   Major procedures and Radiology Reports - PLEASE review detailed and final reports for all details, in brief -      Dg Chest 1 View  04/24/2015  CLINICAL DATA:  Status post fall EXAM: CHEST 1 VIEW COMPARISON:  None. FINDINGS: Lungs are clear.  No pleural effusion or pneumothorax. Cardiomegaly. IMPRESSION: No evidence of acute cardiopulmonary disease. Electronically Signed   By: Charline BillsSriyesh  Krishnan M.D.   On: 04/24/2015 21:36   Ct Head Wo Contrast  04/24/2015  CLINICAL DATA:  Fall, head injury, posterior headache EXAM: CT HEAD WITHOUT CONTRAST TECHNIQUE: Contiguous axial images were obtained from the base of the skull through the vertex without intravenous contrast. COMPARISON:  None. FINDINGS: No evidence of parenchymal hemorrhage or extra-axial fluid collection. No mass lesion, mass effect, or midline shift. No CT evidence of acute infarction. Extensive subcortical white matter and periventricular small vessel ischemic changes. Intracranial atherosclerosis. Global cortical and central atrophy.  Secondary ventriculomegaly. The visualized paranasal sinuses are essentially clear. The mastoid air cells are unopacified. No evidence of calvarial fracture. IMPRESSION: No evidence of acute intracranial abnormality. Atrophy with secondary ventriculomegaly. Extensive small vessel  ischemic changes. Electronically Signed   By: Charline Bills M.D.   On: 04/24/2015 21:41   Ct Thoracic Spine Wo Contrast  04/24/2015  CLINICAL DATA:  Status post fall, with mid back pain. Initial encounter. EXAM: CT THORACIC AND LUMBAR SPINE WITHOUT CONTRAST TECHNIQUE: Multidetector CT imaging of the thoracic and lumbar spine was performed without contrast. Multiplanar CT image  reconstructions were also generated. COMPARISON:  None. FINDINGS: CT THORACIC SPINE FINDINGS There appears to be a mild acute compression fracture involving vertebral body T8, with approximately 20% loss of height. Slight cortical irregularity is noted along the anterior inferior aspect of the vertebral body. There is no evidence of retropulsion or extension to the posterior elements. No additional fractures are seen. Visualized intervertebral disc spaces are grossly unremarkable, aside from minimal vacuum phenomenon along the lower thoracic spine. The visualized bony foramina are grossly unremarkable in appearance. Mild bibasilar atelectasis is noted, with underlying scarring. No significant pleural effusion or pneumothorax is seen, though the lungs are incompletely imaged. Diffuse coronary artery calcifications are seen. The heart is mildly enlarged. CT LUMBAR SPINE FINDINGS There is no evidence of fracture or subluxation along the lumbar spine. Intervertebral disc space narrowing is noted at L1-L2 and L2-L3, and vacuum phenomenon is noted at L2-L3 and L3-L4. Mild endplate sclerotic change is noted at L2-L3. There is mild left convex lumbar scoliosis. Diffuse calcification is noted along the abdominal aorta and its branches. A small hiatal hernia is noted. There is mild diffuse atrophy of the paraspinal musculature. IMPRESSION: 1. Mild acute compression fracture involving vertebral body T8, with approximately 20% loss of height and slight cortical irregularity along the anterior inferior aspect of the vertebral body. No evidence of retropulsion or extension to the posterior elements. 2. Mild degenerative change along the lower thoracic and lumbar spine, and mild left convex lumbar scoliosis. 3. Mild bibasilar atelectasis, with underlying scarring. 4. Mild cardiomegaly and diffuse coronary artery calcifications. 5. Diffuse calcification along the abdominal aorta and its branches. 6. Small hiatal hernia noted. 7.  Mild diffuse atrophy of the paraspinal musculature. Electronically Signed   By: Roanna Raider M.D.   On: 04/24/2015 23:07   Ct Lumbar Spine Wo Contrast  04/24/2015  CLINICAL DATA:  Status post fall, with mid back pain. Initial encounter. EXAM: CT THORACIC AND LUMBAR SPINE WITHOUT CONTRAST TECHNIQUE: Multidetector CT imaging of the thoracic and lumbar spine was performed without contrast. Multiplanar CT image reconstructions were also generated. COMPARISON:  None. FINDINGS: CT THORACIC SPINE FINDINGS There appears to be a mild acute compression fracture involving vertebral body T8, with approximately 20% loss of height. Slight cortical irregularity is noted along the anterior inferior aspect of the vertebral body. There is no evidence of retropulsion or extension to the posterior elements. No additional fractures are seen. Visualized intervertebral disc spaces are grossly unremarkable, aside from minimal vacuum phenomenon along the lower thoracic spine. The visualized bony foramina are grossly unremarkable in appearance. Mild bibasilar atelectasis is noted, with underlying scarring. No significant pleural effusion or pneumothorax is seen, though the lungs are incompletely imaged. Diffuse coronary artery calcifications are seen. The heart is mildly enlarged. CT LUMBAR SPINE FINDINGS There is no evidence of fracture or subluxation along the lumbar spine. Intervertebral disc space narrowing is noted at L1-L2 and L2-L3, and vacuum phenomenon is noted at L2-L3 and L3-L4. Mild endplate sclerotic change is noted at L2-L3. There is mild left convex lumbar scoliosis. Diffuse calcification is noted along the abdominal aorta and its branches. A  small hiatal hernia is noted. There is mild diffuse atrophy of the paraspinal musculature. IMPRESSION: 1. Mild acute compression fracture involving vertebral body T8, with approximately 20% loss of height and slight cortical irregularity along the anterior inferior aspect of the  vertebral body. No evidence of retropulsion or extension to the posterior elements. 2. Mild degenerative change along the lower thoracic and lumbar spine, and mild left convex lumbar scoliosis. 3. Mild bibasilar atelectasis, with underlying scarring. 4. Mild cardiomegaly and diffuse coronary artery calcifications. 5. Diffuse calcification along the abdominal aorta and its branches. 6. Small hiatal hernia noted. 7. Mild diffuse atrophy of the paraspinal musculature. Electronically Signed   By: Roanna Raider M.D.   On: 04/24/2015 23:07   Ct Pelvis Wo Contrast  04/24/2015  CLINICAL DATA:  Fall. Right hip and pelvic pain. Negative x-rays. Initial encounter. EXAM: CT PELVIS WITHOUT CONTRAST TECHNIQUE: Multidetector CT imaging of the pelvis was performed following the standard protocol without intravenous contrast. COMPARISON:  None. FINDINGS: No evidence of acute fracture or dislocation involving either hip joint. No evidence of acute pelvic fracture or pelvic joint diastasis. Mild bilateral hip osteoarthritis is seen. Mild pubic symphysis degenerative changes also noted. Generalized osteopenia demonstrated. Small subchondral geodes noted in the left femoral head. No evidence of pelvic hematoma or mass. Sigmoid diverticulosis is demonstrated, without evidence of diverticulitis. Prior hysterectomy also noted. IMPRESSION: No evidence of acute hip or pelvic fracture. Generalized osteopenia. Mild bilateral hip osteoarthritis and pubic symphysis degenerative changes. Electronically Signed   By: Myles Rosenthal M.D.   On: 04/24/2015 23:01   Dg Knee Complete 4 Views Right  04/24/2015  CLINICAL DATA:  Right knee pain following fall, initial encounter EXAM: RIGHT KNEE - COMPLETE 4+ VIEW COMPARISON:  None. FINDINGS: No acute fracture is identified. Mild degenerative changes are noted in the medial joint space. Vascular calcifications are seen. No gross soft tissue abnormality is noted. IMPRESSION: No acute abnormality seen.  Electronically Signed   By: Alcide Clever M.D.   On: 04/24/2015 21:36   Dg Hip Unilat With Pelvis 2-3 Views Right  04/24/2015  CLINICAL DATA:  Recent fall with right hip pain, initial encounter EXAM: DG HIP (WITH OR WITHOUT PELVIS) 2-3V RIGHT COMPARISON:  None. FINDINGS: There is no evidence of hip fracture or dislocation. There is no evidence of arthropathy or other focal bone abnormality. IMPRESSION: No acute abnormality seen. Electronically Signed   By: Alcide Clever M.D.   On: 04/24/2015 21:35    Micro Results    No results found for this or any previous visit (from the past 240 hour(s)).     Today   Subjective:   Kelsey Luna today has no headache,no chest abdominal pain,no new weakness tingling or numbness, feels much better wants to go home today.   Objective:   Blood pressure 143/55, pulse 55, temperature 97.5 F (36.4 C), temperature source Oral, resp. rate 20, height 5' (1.524 m), weight 59.5 kg (131 lb 2.8 oz), SpO2 92 %.   Intake/Output Summary (Last 24 hours) at 04/26/15 1222 Last data filed at 04/26/15 0239  Gross per 24 hour  Intake      0 ml  Output    500 ml  Net   -500 ml    Exam Awake Alert, Oriented x 3,  regular [place], comfortable [place].AT,PERRAL Supple Neck,No JVD, No cervical lymphadenopathy appriciated.  Symmetrical Chest wall movement, Good air movement bilaterally, CTAB RRR,No Gallops,Rubs or new Murmurs, No Parasternal Heave +ve B.Sounds, Abd Soft, Non tender,  No organomegaly appriciated, No rebound -guarding or rigidity. No Cyanosis, Clubbing or edema, No new Rash or bruise  Data Review   CBC w Diff:  Lab Results  Component Value Date   WBC 11.2* 04/24/2015   HGB 13.1 04/24/2015   HCT 38.1 04/24/2015   PLT 299 04/24/2015   LYMPHOPCT 12 04/24/2015   MONOPCT 6 04/24/2015   EOSPCT 1 04/24/2015   BASOPCT 0 04/24/2015    CMP:  Lab Results  Component Value Date   NA 136 04/24/2015   K 3.5 04/24/2015   CL 102 04/24/2015   CO2 23  04/24/2015   BUN 19 04/24/2015   CREATININE 0.71 04/24/2015   PROT 6.8 04/24/2015   ALBUMIN 3.9 04/24/2015   BILITOT 0.8 04/24/2015   ALKPHOS 63 04/24/2015   AST 27 04/24/2015   ALT 24 04/24/2015  .   Total Time in preparing paper work, data evaluation and todays exam - less than 30 minutes  ELGERGAWY, DAWOOD M.D on 04/26/2015 at 12:22 PM  [facility]   Office  [phone]

## 2015-04-26 NOTE — Progress Notes (Signed)
Physical Therapy Treatment Patient Details Name: Kelsey FaesDorothy A Madril MRN: [id number] DOB: 1927/03/08 Today's Date: 04/26/2015    History of Present Illness 81 yo female walks with a walker, was trying to put a coat on when she lost her balance and fell to the ground hurting her lower back. No LOC. No recent illnesses. Pt denies any Urinary symptoms. Back pain better with iv morphine in the ED, but when tried to ambulate could not. Pt referred for admission for new compression fx and pain control.    PT Comments    Pt reports no pain at rest and generally feels well.  She was instructed in transfer to EOB and was able to complete without assist (HOB elevated).  Min assist to stand from sitting, instructed in gait with walker with training from proper walker placement and safety with turns.  She was able to ambulate 100' without stress.  Instruction needed for proper transfer to chair.  She should definitely be able to transfer to home at d/c.  I did explain to pt and daughter that while a CASH brace is typically worn for this type of fracture there would be no way that she could tolerate it with her significant kyphoscoliosis deformity.  Follow Up Recommendations  Home health PT     Equipment Recommendations  Hospital bed;3in1 (PT)    Recommendations for Other Services  none     Precautions / Restrictions Precautions Precautions: Fall Restrictions Weight Bearing Restrictions: No    Mobility  Bed Mobility Overal bed mobility: Needs Assistance Bed Mobility: Supine to Sit     Supine to sit: Min guard        Transfers Overall transfer level: Needs assistance Equipment used: Rolling walker (2 wheeled) Transfers: Sit to/from Stand Sit to Stand: Min assist            Ambulation/Gait Ambulation/Gait assistance: Min guard Ambulation Distance (Feet): 100 Feet Assistive device: Rolling walker (2 wheeled) Gait Pattern/deviations: Trunk flexed;WFL(Within Functional Limits)   Gait velocity interpretation: <1.8 ft/sec, indicative of risk for recurrent falls General Gait Details: instruction needed in staying closer to the walker, also instructed in safe transfer from stand to sit as she tries to sit before being square with the bed   Stairs            Wheelchair Mobility    Modified Rankin (Stroke Patients Only)       Balance                                    Cognition Arousal/Alertness: Awake/alert Behavior During Therapy: WFL for tasks assessed/performed Overall Cognitive Status: Within Functional Limits for tasks assessed                      Exercises      General Comments        Pertinent Vitals/Pain Pain Assessment: No/denies pain    Home Living                      Prior Function            PT Goals (current goals can now be found in the care plan section) Progress towards PT goals: Progressing toward goals (most all goals exceeded)    Frequency  7X/week    PT Plan Current plan remains appropriate    Co-evaluation  End of Session Equipment Utilized During Treatment: Gait belt Activity Tolerance: Patient tolerated treatment well;No increased pain Patient left: in chair;with call bell/phone within reach;with family/visitor present     Time: 0981-1914 PT Time Calculation (min) (ACUTE ONLY): 31 min  Charges:  $Gait Training: 8-22 mins $Therapeutic Activity: 8-22 mins                    G CodesKonrad Penta  PT 04/26/2015, 10:28 AM [phone]

## 2015-07-14 ENCOUNTER — Encounter (HOSPITAL_COMMUNITY): Payer: Self-pay | Admitting: Emergency Medicine

## 2015-07-14 ENCOUNTER — Inpatient Hospital Stay (HOSPITAL_COMMUNITY)
Admission: EM | Admit: 2015-07-14 | Discharge: 2015-07-15 | DRG: 603 | Disposition: A | Discharge status: Home / Self Care | Payer: Medicare Other | Attending: Internal Medicine | Admitting: Internal Medicine

## 2015-07-14 ENCOUNTER — Observation Stay (HOSPITAL_COMMUNITY): Payer: Medicare Other

## 2015-07-14 DIAGNOSIS — L723 Sebaceous cyst: Secondary | ICD-10-CM | POA: Diagnosis present

## 2015-07-14 DIAGNOSIS — K219 Gastro-esophageal reflux disease without esophagitis: Secondary | ICD-10-CM | POA: Diagnosis present

## 2015-07-14 DIAGNOSIS — I2510 Atherosclerotic heart disease of native coronary artery without angina pectoris: Secondary | ICD-10-CM | POA: Diagnosis present

## 2015-07-14 DIAGNOSIS — L03313 Cellulitis of chest wall: Secondary | ICD-10-CM | POA: Diagnosis not present

## 2015-07-14 DIAGNOSIS — M545 Low back pain: Secondary | ICD-10-CM | POA: Diagnosis present

## 2015-07-14 DIAGNOSIS — M25559 Pain in unspecified hip: Secondary | ICD-10-CM | POA: Diagnosis present

## 2015-07-14 DIAGNOSIS — Z809 Family history of malignant neoplasm, unspecified: Secondary | ICD-10-CM

## 2015-07-14 DIAGNOSIS — I10 Essential (primary) hypertension: Secondary | ICD-10-CM | POA: Diagnosis present

## 2015-07-14 DIAGNOSIS — G8929 Other chronic pain: Secondary | ICD-10-CM | POA: Diagnosis present

## 2015-07-14 DIAGNOSIS — J869 Pyothorax without fistula: Secondary | ICD-10-CM | POA: Diagnosis present

## 2015-07-14 DIAGNOSIS — Z90710 Acquired absence of both cervix and uterus: Secondary | ICD-10-CM | POA: Diagnosis not present

## 2015-07-14 DIAGNOSIS — F0390 Unspecified dementia without behavioral disturbance: Secondary | ICD-10-CM | POA: Diagnosis present

## 2015-07-14 DIAGNOSIS — Z8249 Family history of ischemic heart disease and other diseases of the circulatory system: Secondary | ICD-10-CM

## 2015-07-14 DIAGNOSIS — F418 Other specified anxiety disorders: Secondary | ICD-10-CM | POA: Diagnosis present

## 2015-07-14 DIAGNOSIS — E785 Hyperlipidemia, unspecified: Secondary | ICD-10-CM | POA: Diagnosis present

## 2015-07-14 DIAGNOSIS — Z66 Do not resuscitate: Secondary | ICD-10-CM | POA: Diagnosis present

## 2015-07-14 DIAGNOSIS — L02213 Cutaneous abscess of chest wall: Secondary | ICD-10-CM | POA: Diagnosis present

## 2015-07-14 DIAGNOSIS — F03A0 Unspecified dementia, mild, without behavioral disturbance, psychotic disturbance, mood disturbance, and anxiety: Secondary | ICD-10-CM | POA: Diagnosis present

## 2015-07-14 DIAGNOSIS — Z833 Family history of diabetes mellitus: Secondary | ICD-10-CM

## 2015-07-14 HISTORY — DX: Spinal stenosis, site unspecified: M48.00

## 2015-07-14 HISTORY — DX: Other intervertebral disc degeneration, lumbosacral region: M51.37

## 2015-07-14 HISTORY — DX: Unspecified fracture of upper end of unspecified humerus, initial encounter for closed fracture: S42.209A

## 2015-07-14 HISTORY — DX: Unspecified dementia, unspecified severity, without behavioral disturbance, psychotic disturbance, mood disturbance, and anxiety: F03.90

## 2015-07-14 HISTORY — DX: Other chronic pain: G89.29

## 2015-07-14 LAB — COMPREHENSIVE METABOLIC PANEL
ALBUMIN: 3.4 g/dL — AB (ref 3.5–5.0)
ALT: 16 U/L (ref 14–54)
ANION GAP: 9 (ref 5–15)
AST: 19 U/L (ref 15–41)
Alkaline Phosphatase: 82 U/L (ref 38–126)
BILIRUBIN TOTAL: 0.4 mg/dL (ref 0.3–1.2)
BUN: 12 mg/dL (ref 6–20)
CO2: 27 mmol/L (ref 22–32)
Calcium: 9.4 mg/dL (ref 8.9–10.3)
Chloride: 103 mmol/L (ref 101–111)
Creatinine, Ser: 0.62 mg/dL (ref 0.44–1.00)
GFR calc Af Amer: >60 mL/min (ref >60)
GLUCOSE: 97 mg/dL (ref 65–99)
POTASSIUM: 3.8 mmol/L (ref 3.5–5.1)
Sodium: 139 mmol/L (ref 135–145)
TOTAL PROTEIN: 6.2 g/dL — AB (ref 6.5–8.1)

## 2015-07-14 LAB — BASIC METABOLIC PANEL
ANION GAP: 8 (ref 5–15)
BUN: 13 mg/dL (ref 6–20)
CALCIUM: 9.7 mg/dL (ref 8.9–10.3)
CO2: 28 mmol/L (ref 22–32)
Chloride: 103 mmol/L (ref 101–111)
Creatinine, Ser: 0.73 mg/dL (ref 0.44–1.00)
GFR calc Af Amer: >60 mL/min (ref >60)
GLUCOSE: 112 mg/dL — AB (ref 65–99)
Potassium: 3.5 mmol/L (ref 3.5–5.1)
Sodium: 139 mmol/L (ref 135–145)

## 2015-07-14 LAB — CBC WITH DIFFERENTIAL/PLATELET
BASOS PCT: 0 %
Basophils Absolute: 0 10*3/uL (ref 0.0–0.1)
EOS ABS: 0.1 10*3/uL (ref 0.0–0.7)
Eosinophils Relative: 2 %
HCT: 37.8 % (ref 36.0–46.0)
HEMOGLOBIN: 12.8 g/dL (ref 12.0–15.0)
LYMPHS ABS: 1.8 10*3/uL (ref 0.7–4.0)
Lymphocytes Relative: 35 %
MCH: 31.4 pg (ref 26.0–34.0)
MCHC: 33.9 g/dL (ref 30.0–36.0)
MCV: 92.9 fL (ref 78.0–100.0)
Monocytes Absolute: 0.5 10*3/uL (ref 0.1–1.0)
Monocytes Relative: 10 %
NEUTROS PCT: 53 %
Neutro Abs: 2.8 10*3/uL (ref 1.7–7.7)
Platelets: 324 10*3/uL (ref 150–400)
RBC: 4.07 MIL/uL (ref 3.87–5.11)
RDW: 12.8 % (ref 11.5–15.5)
WBC: 5.3 10*3/uL (ref 4.0–10.5)

## 2015-07-14 LAB — I-STAT CG4 LACTIC ACID, ED: Lactic Acid, Venous: 1.22 mmol/L (ref 0.5–2.0)

## 2015-07-14 MED ORDER — ALPRAZOLAM 0.5 MG PO TABS
0.5000 mg | ORAL_TABLET | Freq: Two times a day (BID) | ORAL | Status: DC
  Administered 2015-07-14 – 2015-07-15 (×2): 0.5 mg via ORAL
  Filled 2015-07-14 (×2): qty 1

## 2015-07-14 MED ORDER — SENNOSIDES-DOCUSATE SODIUM 8.6-50 MG PO TABS
1.0000 | ORAL_TABLET | Freq: Every day | ORAL | Status: DC
  Administered 2015-07-14: 1 via ORAL
  Filled 2015-07-14: qty 1

## 2015-07-14 MED ORDER — MAGNESIUM OXIDE 400 (241.3 MG) MG PO TABS
400.0000 mg | ORAL_TABLET | Freq: Every morning | ORAL | Status: DC
  Filled 2015-07-14: qty 1

## 2015-07-14 MED ORDER — BUPROPION HCL ER (XL) 300 MG PO TB24
300.0000 mg | ORAL_TABLET | Freq: Every day | ORAL | Status: DC
  Filled 2015-07-14 (×3): qty 1

## 2015-07-14 MED ORDER — CLINDAMYCIN PHOSPHATE 600 MG/50ML IV SOLN
600.0000 mg | Freq: Once | INTRAVENOUS | Status: AC
  Administered 2015-07-14: 600 mg via INTRAVENOUS
  Filled 2015-07-14: qty 50

## 2015-07-14 MED ORDER — ONDANSETRON HCL 4 MG PO TABS: 4.0000 mg | ORAL_TABLET | Freq: Four times a day (QID) | ORAL | Status: DC | PRN

## 2015-07-14 MED ORDER — VITAMIN C 500 MG PO TABS
1000.0000 mg | ORAL_TABLET | Freq: Every morning | ORAL | Status: DC
  Administered 2015-07-15: 1000 mg via ORAL
  Filled 2015-07-14: qty 2

## 2015-07-14 MED ORDER — FENTANYL 50 MCG/HR TD PT72
50.0000 ug | MEDICATED_PATCH | TRANSDERMAL | Status: DC
  Administered 2015-07-14: 50 ug via TRANSDERMAL
  Filled 2015-07-14: qty 1

## 2015-07-14 MED ORDER — ALBUTEROL SULFATE (2.5 MG/3ML) 0.083% IN NEBU: 2.5000 mg | INHALATION_SOLUTION | RESPIRATORY_TRACT | Status: DC | PRN

## 2015-07-14 MED ORDER — HYDROCHLOROTHIAZIDE 25 MG PO TABS
12.5000 mg | ORAL_TABLET | Freq: Every day | ORAL | Status: DC
  Administered 2015-07-15: 12.5 mg via ORAL
  Filled 2015-07-14: qty 1

## 2015-07-14 MED ORDER — BUSPIRONE HCL 15 MG PO TABS
15.0000 mg | ORAL_TABLET | Freq: Two times a day (BID) | ORAL | Status: DC
Start: 2015-07-14 — End: 2015-07-15
  Administered 2015-07-14 – 2015-07-15 (×2): 15 mg via ORAL
  Filled 2015-07-14: qty 3
  Filled 2015-07-14: qty 1

## 2015-07-14 MED ORDER — ADULT MULTIVITAMIN W/MINERALS CH
1.0000 | ORAL_TABLET | Freq: Every morning | ORAL | Status: DC
  Administered 2015-07-15: 1 via ORAL
  Filled 2015-07-14: qty 1

## 2015-07-14 MED ORDER — POLYETHYLENE GLYCOL 3350 17 G PO PACK: 17.0000 g | PACK | Freq: Every day | ORAL | Status: DC | PRN

## 2015-07-14 MED ORDER — POLYETHYLENE GLYCOL 3350 17 GM/SCOOP PO POWD: 17.0000 g | Freq: Every day | ORAL | Status: DC | PRN

## 2015-07-14 MED ORDER — POTASSIUM CHLORIDE IN NACL 20-0.9 MEQ/L-% IV SOLN
INTRAVENOUS | Status: DC
  Administered 2015-07-14: via INTRAVENOUS

## 2015-07-14 MED ORDER — PANTOPRAZOLE SODIUM 40 MG PO TBEC
40.0000 mg | DELAYED_RELEASE_TABLET | Freq: Every day | ORAL | Status: DC
  Administered 2015-07-15: 40 mg via ORAL
  Filled 2015-07-14 (×2): qty 1

## 2015-07-14 MED ORDER — ONDANSETRON HCL 4 MG/2ML IJ SOLN: 4.0000 mg | Freq: Four times a day (QID) | INTRAMUSCULAR | Status: DC | PRN

## 2015-07-14 MED ORDER — VANCOMYCIN HCL IN DEXTROSE 1-5 GM/200ML-% IV SOLN
1000.0000 mg | Freq: Once | INTRAVENOUS | Status: AC
  Administered 2015-07-14: 1000 mg via INTRAVENOUS
  Filled 2015-07-14: qty 200

## 2015-07-14 MED ORDER — ACETAMINOPHEN 650 MG RE SUPP: 650.0000 mg | Freq: Four times a day (QID) | RECTAL | Status: DC | PRN

## 2015-07-14 MED ORDER — LISINOPRIL 10 MG PO TABS
20.0000 mg | ORAL_TABLET | Freq: Every morning | ORAL | Status: DC
  Administered 2015-07-15: 20 mg via ORAL
  Filled 2015-07-14: qty 2

## 2015-07-14 MED ORDER — LEVOFLOXACIN IN D5W 750 MG/150ML IV SOLN
750.0000 mg | Freq: Once | INTRAVENOUS | Status: AC
  Administered 2015-07-14: 750 mg via INTRAVENOUS
  Filled 2015-07-14: qty 150

## 2015-07-14 MED ORDER — GUAIFENESIN-DM 100-10 MG/5ML PO SYRP: 5.0000 mL | ORAL_SOLUTION | ORAL | Status: DC | PRN

## 2015-07-14 MED ORDER — PANTOPRAZOLE SODIUM 40 MG PO TBEC: 40.0000 mg | DELAYED_RELEASE_TABLET | Freq: Every day | ORAL | Status: DC

## 2015-07-14 MED ORDER — ACETAMINOPHEN 325 MG PO TABS
650.0000 mg | ORAL_TABLET | Freq: Four times a day (QID) | ORAL | Status: DC | PRN
  Administered 2015-07-15: 650 mg via ORAL
  Filled 2015-07-14: qty 2

## 2015-07-14 MED ORDER — OXYCODONE HCL 5 MG PO TABS
5.0000 mg | ORAL_TABLET | Freq: Four times a day (QID) | ORAL | Status: DC | PRN
  Administered 2015-07-15: 5 mg via ORAL
  Filled 2015-07-14: qty 1

## 2015-07-14 NOTE — ED Notes (Signed)
Patient is resting comfortably. 

## 2015-07-14 NOTE — ED Notes (Signed)
Notice chest wound on Monday and area was drained at Urgent Care in [place].  Pt instructed to change dressing daily and took pt back today for wound check.  PCP told daughter to bring pt to ED for possible MRSA.  P t was given antibiotic IM at doctors office today.

## 2015-07-14 NOTE — ED Notes (Signed)
Pt sent over from Dr. Jake Shark in [place]. Initially seen at PCP earlier in the week, referred to surgeon but was 15 min late for appt and was turned away. Went to Urgent care again today and was sent here. Wound is packed and dressed. Redness to chest has spread since earlier today per daughter. NAD.

## 2015-07-14 NOTE — H&P (Signed)
[facility] History and Physical  Kelsey Luna ZOX:[id number] DOB: Mar 31, 1927 DOA: 07/14/2015  Referring physician: ED physician, Dr. Clarene Duke PCP: Kelsey Nations, MD   Chief Complaint: Spreading redness on chest wall  HPI: Kelsey Luna is a 82 y.o. female with a history of CAD, HTN, chronic pain syndrome on chronic Duragesic, mild dementia, and depression with anxiety, who presents to the ED for worsening cellulitis. History is being provided primarily by her daughter, [name]. Accordingly, several days ago, [name] noticed that the patient had a knott or what sounds like a boil between her breasts. The patient says that it had been there a while, but she could not give a specific length of time. She presented to her PCPs office 3 days ago. He prescribed doxycycline and referred her to a surgeon for an I&D. The appointment was yesterday, but she got there too late for evaluation. Therefore, on yesterday, she went to a local urgent care ([facility] in [place]). The physician there I & D'd it and then packed it and advised her to return today. Today, it was reexamined. At that time, the erythema had spread to both breasts and beyond the initial site of incision and drainage. She was given 1 g of Rocephin and advised to come to the ED for further evaluation and management. The patient does have some chest "soreness". She may have had subjective fever and chills at home, but no shortness of breath, nausea, vomiting, diarrhea, or pain with urination.  In the ED, she is afebrile and hemodynamically stable. Her lactic acid level is within normal limits. Chest x-ray was ordered and is pending. She is being admitted for further evaluation and management.    Review of Systems:  Review of systems as above in history present illness. In addition, she has chronic low back pain, chronic hip pain, she has difficulty walking, otherwise review of systems is  negative.  Past Medical History  Diagnosis Date  . CAD (coronary artery disease)     DES LAD 6/03, LVEF 67%  . Hyperlipidemia   . Hypertension   . GERD (gastroesophageal reflux disease)   . Hiatal hernia   . Anxiety   . Depression   . SPINAL STENOSIS 12/20/2009    Qualifier: Diagnosis of  By: Romeo Apple MD, Duffy Rhody    . DEGENERATIVE DISC DISEASE, LUMBOSACRAL SPINE 12/20/2009    Qualifier: Diagnosis of  By: Romeo Apple MD, Duffy Rhody    . FRACTURE, HUMERUS, PROXIMAL 03/26/2010    Qualifier: Diagnosis of  By: Romeo Apple MD, Duffy Rhody    . Dementia   . Chronic pain    Past Surgical History  Procedure Laterality Date  . Cataracts    . Nasal sinus surgery    . Tonsilectomy, adenoidectomy, bilateral myringotomy and tubes    . Total abdominal hysterectomy    . Hemorrhoid surgery    . Back surgery    . Cholecystectomy     Social History: She is widowed. She lives with her daughter Kelsey Luna in [place]. She ambulates with a walker and via a wheelchair.  reports that she has never smoked. She has never used smokeless tobacco. She reports that she does not drink alcohol or use illicit drugs. She is a DO NOT RESUSCITATE per [name].  Allergies  Allergen Reactions  . Pseudoephedrine Other (See Comments)    Pt states she lost her memory for a day when she took Sudafed.     Family History  Problem Relation Age  of Onset  . Coronary artery disease Father   . Cancer Father   . Diabetes Father   . Diabetes Mother   . Diabetes Sister     Prior to Admission medications   Medication Sig Start Date End Date Taking? Authorizing Provider  ALPRAZolam Prudy Feeler) 0.5 MG tablet Take 0.5-1 mg by mouth 2 (two) times daily. One in the morning and two tabs at bedtime 06/15/15  Yes Historical Provider, MD  buPROPion (WELLBUTRIN XL) 300 MG 24 hr tablet Take 300 mg by mouth daily.   Yes Historical Provider, MD  doxycycline (VIBRAMYCIN) 100 MG capsule Take by mouth. 07/10/15 07/20/15 Yes Historical Provider, MD   hydrochlorothiazide (HYDRODIURIL) 25 MG tablet Take 25 mg by mouth daily.   Yes Historical Provider, MD  Multiple Vitamin (MULTIVITAMIN WITH MINERALS) TABS tablet Take 1 tablet by mouth every morning.   Yes Historical Provider, MD  omeprazole (PRILOSEC) 20 MG capsule Take 20 mg by mouth daily.     Yes Historical Provider, MD  Potassium 99 MG TABS Take 1 tablet by mouth every morning.    Yes Historical Provider, MD  acetaminophen (TYLENOL) 325 MG tablet Take 1 tablet (325 mg total) by mouth every 6 (six) hours as needed for mild pain. 04/26/15   Starleen Arms, MD  Ascorbic Acid (VITAMIN C) 1000 MG tablet Take 1,000 mg by mouth every morning.    Historical Provider, MD  busPIRone (BUSPAR) 15 MG tablet Take 15 mg by mouth 2 (two) times daily.    Historical Provider, MD  Cranberry-Cholecalciferol (SUPER CRANBERRY/VITAMIN D3) 4200-500 MG-UNIT CAPS Take 1 capsule by mouth every morning.    Historical Provider, MD  fentaNYL (DURAGESIC - DOSED MCG/HR) 50 MCG/HR  06/22/15   Historical Provider, MD  lisinopril (PRINIVIL,ZESTRIL) 20 MG tablet Take 20 mg by mouth every morning.    Historical Provider, MD  magnesium oxide (MAG-OX) 400 MG tablet Take 400 mg by mouth every morning.    Historical Provider, MD  methocarbamol (ROBAXIN) 500 MG tablet Take 1 tablet (500 mg total) by mouth every 6 (six) hours as needed for muscle spasms. 04/26/15   Leana Roe Elgergawy, MD  oxyCODONE (OXY IR/ROXICODONE) 5 MG immediate release tablet Take 1 tablet (5 mg total) by mouth every 6 (six) hours as needed for severe pain. 04/26/15   Leana Roe Elgergawy, MD  polyethylene glycol (MIRALAX) powder Take 17 g by mouth daily.     Historical Provider, MD   Physical Exam: Filed Vitals:   07/14/15 1445 07/14/15 1448 07/14/15 1832  BP:  140/64 151/97  Pulse:  77 70  Temp:  98.5 F (36.9 C) 97.9 F (36.6 C)  TempSrc:  Oral Oral  Resp:  16 16  Height: 5' (1.524 m)    Weight: 59.421 kg (131 lb)    SpO2:  98% 98%    Wt Readings  from Last 3 Encounters:  07/14/15 59.421 kg (131 lb)  04/25/15 59.5 kg (131 lb 2.8 oz)  06/06/13 58.968 kg (130 lb)    General:  Appears calm and comfortable; pleasant 82 year old woman in no acute distress. Eyes: PERRL, normal lids, irises & conjunctiva; conjunctivae are clear and sclerae are white. ENT: grossly normal hearing; oropharynx mucous membranes are mildly dry. Neck: no LAD, masses or thyromegaly; supple. Cardiovascular: S1, S2, no murmurs rubs or gallops. No pedal edema. Telemetry: Not applicable.  Respiratory: CTA bilaterally, no w/r/r. Normal respiratory effort. Abdomen: Positive bowel sounds, soft, nontender, nondistended. Skin: Chest wall, between her breasts, there is  a small nidus with a slightly bloody wick protruding from it and surrounding erythema extending to both breasts bilaterally; mildly to moderately tender over the incision site; malodorous. Musculoskeletal: grossly normal tone BUE/BLE; no acute hot red joints. Psychiatric: grossly normal mood and affect, speech fluent and appropriate Neurologic: grossly non-focal; cranial nerves II through XII are grossly intact.           Labs on Admission:  Basic Metabolic Panel:  Recent Labs Lab 07/14/15 1820  NA 139  K 3.5  CL 103  CO2 28  GLUCOSE 112*  BUN 13  CREATININE 0.73  CALCIUM 9.7   Liver Function Tests: No results for input(s): AST, ALT, ALKPHOS, BILITOT, PROT, ALBUMIN in the last 168 hours. No results for input(s): LIPASE, AMYLASE in the last 168 hours. No results for input(s): AMMONIA in the last 168 hours. CBC:  Recent Labs Lab 07/14/15 1820  WBC 5.3  NEUTROABS 2.8  HGB 12.8  HCT 37.8  MCV 92.9  PLT 324   Cardiac Enzymes: No results for input(s): CKTOTAL, CKMB, CKMBINDEX, TROPONINI in the last 168 hours.  BNP (last 3 results) No results for input(s): BNP in the last 8760 hours.  ProBNP (last 3 results) No results for input(s): PROBNP in the last 8760 hours.  CBG: No results  for input(s): GLUCAP in the last 168 hours.  Radiological Exams on Admission: Dg Chest 2 View  07/14/2015  CLINICAL DATA:  Chest wound and drainage noticed on Monday. EXAM: CHEST  2 VIEW COMPARISON:  04/24/2015 FINDINGS: Mild cardiomegaly. No confluent airspace opacities or effusions. Moderate compression fracture in the mid thoracic spine, progressed since prior thoracic spine CT performed 04/24/2015. IMPRESSION: No acute cardiopulmonary disease. Progressive mid thoracic compression fracture. Electronically Signed   By: Charlett Nose M.D.   On: 07/14/2015 20:41    EKG: Independently reviewed.   Assessment/Plan Principal Problem:   Cellulitis of chest wall Active Problems:   Abscess of chest (HCC)   Essential hypertension, benign   CORONARY ATHEROSCLEROSIS NATIVE CORONARY ARTERY   Depression with anxiety   Mild dementia   Chronic pain   1. Cellulitis of the chest wall with probable abscess. The patient had a chest wall carbuncle or boil I&D 2 days ago by a physician at [facility] urgent care center in [place]. The cellulitis has apparently spread to her breasts now. On exam, there is a bloody wick protruding from the incision site with spreading cellulitis. The patient is afebrile and nontoxic appearing. Chest x-ray reveals no obvious acute abnormalities. She was given Rocephin at the urgent care. She was given IV clindamycin in the ED. Will start antibiotic treatment with vancomycin and and Levaquin. Will consult general surgeon, Dr. Lovell Sheehan for recommendations regarding further wound care and to see if the area needs further incision and drainage. There is a low threshold for ordering a CT of her chest if she becomes febrile or more toxic appearing. 2. We'll continue most of her chronic medications. Will decrease the dose of HCTZ while she is being hydrated gently. Would increase it back to 25 mg daily when she is better hydrated. 3. We'll continue her chronic analgesics with fentanyl patch and as  needed oxycodone. 4. DVT prophylaxis will be given with SCDs. If there is no further bloody drainage from the incision site and/or if there is no need for further incision and drainage, would consider starting Lovenox or subcutaneous heparin for DVT prophylaxis in the next 24-48 hours.     Code Status: DO  NOT RESUSCITATE per discussion with POA and daughter Kelsey Luna DVT Prophylaxis: SCDs Family Communication: Discussed with [name], daughter and POA Disposition Plan: Anticipate discharge in the next 2-3 days.  Time spent: One hour  [facility] Pager [phone]

## 2015-07-14 NOTE — ED Provider Notes (Signed)
CSN: [id number]     Arrival date & time 07/14/15  1437 History   First MD Initiated Contact with Patient 07/14/15 1747     Chief Complaint  Patient presents with  . chest wound       HPI Pt was seen at 1755. Per pt and her family, c/o gradual onset and worsening of constant abscess and rash to chest that began 4 days ago. Pt was evaluated by her PMD 4 days ago for abscess mid-chest, and started on antibiotics. Pt was evaluated at an [facility] yesterday, had I&D performed. Pt returned to the [facility] today for a wound check and packing change. Pt's family states since yesterday pt's chest has "gotten more red" starting at the abscess/I&D area, and now spread to her bilat breasts. [facility] performed wound care, then sent pt to the ED for IV abx/admission. Denies fevers, no other areas of rash, no blisters, no injury.    Past Medical History  Diagnosis Date  . CAD (coronary artery disease)     DES LAD 6/03, LVEF 67%  . Hyperlipidemia   . Hypertension   . GERD (gastroesophageal reflux disease)   . Hiatal hernia   . Anxiety   . Depression    Past Surgical History  Procedure Laterality Date  . Cataracts    . Nasal sinus surgery    . Tonsilectomy, adenoidectomy, bilateral myringotomy and tubes    . Total abdominal hysterectomy    . Hemorrhoid surgery    . Back surgery    . Cholecystectomy     Family History  Problem Relation Age of Onset  . Coronary artery disease Father   . Cancer Father   . Diabetes Father   . Diabetes Mother   . Diabetes Sister    Social History  Substance Use Topics  . Smoking status: Never Smoker   . Smokeless tobacco: Never Used  . Alcohol Use: No    Review of Systems ROS: Statement: All systems negative except as marked or noted in the HPI; Constitutional: Negative for fever and chills. ; ; Eyes: Negative for eye pain, redness and discharge. ; ; ENMT: Negative for ear pain, hoarseness, nasal congestion, sinus pressure and sore throat. ; ; Cardiovascular: Negative  for chest pain, palpitations, diaphoresis, dyspnea and peripheral edema. ; ; Respiratory: Negative for cough, wheezing and stridor. ; ; Gastrointestinal: Negative for nausea, vomiting, diarrhea, abdominal pain, blood in stool, hematemesis, jaundice and rectal bleeding. . ; ; Genitourinary: Negative for dysuria, flank pain and hematuria. ; ; Musculoskeletal: Negative for back pain and neck pain. Negative for swelling and trauma.; ; Skin: +rash. Negative for pruritus, abrasions, blisters, bruising and skin lesion.; ; Neuro: Negative for headache, lightheadedness and neck stiffness. Negative for weakness, altered level of consciousness , altered mental status, extremity weakness, paresthesias, involuntary movement, seizure and syncope.      Allergies  Pseudoephedrine  Home Medications   Prior to Admission medications   Medication Sig Start Date End Date Taking? Authorizing Provider  acetaminophen (TYLENOL) 325 MG tablet Take 1 tablet (325 mg total) by mouth every 6 (six) hours as needed for mild pain. 04/26/15   Leana Roe Elgergawy, MD  ALPRAZolam Prudy Feeler) 0.5 MG tablet Take 0.25-1 mg by mouth 2 (two) times daily. Half tablet in the morning and two tabs at bedtime    Historical Provider, MD  Ascorbic Acid (VITAMIN C) 1000 MG tablet Take 1,000 mg by mouth every morning.    Historical Provider, MD  buPROPion (WELLBUTRIN XL) 300  MG 24 hr tablet Take 300 mg by mouth daily.    Historical Provider, MD  busPIRone (BUSPAR) 15 MG tablet Take 15 mg by mouth 2 (two) times daily.    Historical Provider, MD  Cranberry-Cholecalciferol (SUPER CRANBERRY/VITAMIN D3) 4200-500 MG-UNIT CAPS Take 1 capsule by mouth every morning.    Historical Provider, MD  hydrochlorothiazide (HYDRODIURIL) 25 MG tablet Take 25 mg by mouth daily.    Historical Provider, MD  lisinopril (PRINIVIL,ZESTRIL) 20 MG tablet Take 20 mg by mouth every morning.    Historical Provider, MD  magnesium oxide (MAG-OX) 400 MG tablet Take 400 mg by mouth  every morning.    Historical Provider, MD  methocarbamol (ROBAXIN) 500 MG tablet Take 1 tablet (500 mg total) by mouth every 6 (six) hours as needed for muscle spasms. 04/26/15   Starleen Arms, MD  Multiple Vitamin (MULTIVITAMIN WITH MINERALS) TABS tablet Take 1 tablet by mouth every morning.    Historical Provider, MD  omeprazole (PRILOSEC) 20 MG capsule Take 20 mg by mouth daily.      Historical Provider, MD  oxyCODONE (OXY IR/ROXICODONE) 5 MG immediate release tablet Take 1 tablet (5 mg total) by mouth every 6 (six) hours as needed for severe pain. 04/26/15   Leana Roe Elgergawy, MD  polyethylene glycol (MIRALAX) powder Take 17 g by mouth daily.     Historical Provider, MD  Potassium 99 MG TABS Take 1 tablet by mouth every morning.     Historical Provider, MD   BP 151/97 mmHg  Pulse 70  Temp(Src) 97.9 F (36.6 C) (Oral)  Resp 16  Ht 5' (1.524 m)  Wt 131 lb (59.421 kg)  BMI 25.58 kg/m2  SpO2 98% Physical Exam  1800: Physical examination:  Nursing notes reviewed; Vital signs and O2 SAT reviewed;  Constitutional: Well developed, Well nourished, Well hydrated, In no acute distress; Head:  Normocephalic, atraumatic; Eyes: EOMI, PERRL, No scleral icterus; ENMT: Mouth and pharynx normal, Mucous membranes moist; Neck: Supple, Full range of motion, No lymphadenopathy; Cardiovascular: Regular rate and rhythm, No gallop; Respiratory: Breath sounds clear & equal bilaterally, No wheezes.  Speaking full sentences with ease, Normal respiratory effort/excursion; Chest: No deformity. Movement normal. +packing in place in open wound mid-chest with thick purulent drainage, +surrounding erythema extending out to bilat breasts. No palp fluctuance, no soft tissue crepitus.; Abdomen: Soft, Nontender, Nondistended, Normal bowel sounds; Genitourinary: No CVA tenderness; Extremities: Pulses normal, No tenderness, No edema, No calf edema or asymmetry.; Neuro: AA&Ox3, Major CN grossly intact.  Speech clear. No gross  focal motor or sensory deficits in extremities.; Skin: Color normal, Warm, Dry.   ED Course  Procedures (including critical care time)  Labs Review  Imaging Review  I have personally reviewed and evaluated these images and lab results as part of my medical decision-making.   EKG Interpretation None      MDM  MDM Reviewed: previous chart, nursing note and vitals Reviewed previous: labs Interpretation: labs and x-ray     Results for orders placed or performed during the hospital encounter of 07/14/15  Basic metabolic panel  Result Value Ref Range   Sodium 139 135 - 145 mmol/L   Potassium 3.5 3.5 - 5.1 mmol/L   Chloride 103 101 - 111 mmol/L   CO2 28 22 - 32 mmol/L   Glucose, Bld 112 (H) 65 - 99 mg/dL   BUN 13 6 - 20 mg/dL   Creatinine, Ser 1.61 0.44 - 1.00 mg/dL   Calcium 9.7 8.9 -  10.3 mg/dL   GFR calc non Af Amer >60 >60 mL/min   GFR calc Af Amer >60 >60 mL/min   Anion gap 8 5 - 15  CBC with Differential  Result Value Ref Range   WBC 5.3 4.0 - 10.5 K/uL   RBC 4.07 3.87 - 5.11 MIL/uL   Hemoglobin 12.8 12.0 - 15.0 g/dL   HCT 16.1 09.6 - 04.5 %   MCV 92.9 78.0 - 100.0 fL   MCH 31.4 26.0 - 34.0 pg   MCHC 33.9 30.0 - 36.0 g/dL   RDW 40.9 81.1 - 91.4 %   Platelets 324 150 - 400 K/uL   Neutrophils Relative % 53 %   Neutro Abs 2.8 1.7 - 7.7 K/uL   Lymphocytes Relative 35 %   Lymphs Abs 1.8 0.7 - 4.0 K/uL   Monocytes Relative 10 %   Monocytes Absolute 0.5 0.1 - 1.0 K/uL   Eosinophils Relative 2 %   Eosinophils Absolute 0.1 0.0 - 0.7 K/uL   Basophils Relative 0 %   Basophils Absolute 0.0 0.0 - 0.1 K/uL  I-Stat CG4 Lactic Acid, ED  Result Value Ref Range   Lactic Acid, Venous 1.22 0.5 - 2.0 mmol/L   Dg Chest 2 View 07/14/2015  CLINICAL DATA:  Chest wound and drainage noticed on Monday. EXAM: CHEST  2 VIEW COMPARISON:  04/24/2015 FINDINGS: Mild cardiomegaly. No confluent airspace opacities or effusions. Moderate compression fracture in the mid thoracic spine,  progressed since prior thoracic spine CT performed 04/24/2015. IMPRESSION: No acute cardiopulmonary disease. Progressive mid thoracic compression fracture. Electronically Signed   By: Charlett Nose M.D.   On: 07/14/2015 20:41    1945:  IV clindamycin given. Dx and testing d/w pt and family.  Questions answered.  Verb understanding, agreeable to admit.  T/C to [facility] Dr. Sherrie Mustache, case discussed, including:  HPI, pertinent PM/SHx, VS/PE, dx testing, ED course and treatment:  Agreeable to admit, requests to obtain CXR, write temporary orders, obtain observation medical bed to team APAdmits.     Samuel Jester, DO 07/16/15 2307

## 2015-07-15 ENCOUNTER — Encounter (HOSPITAL_COMMUNITY): Payer: Self-pay | Admitting: *Deleted

## 2015-07-15 DIAGNOSIS — L03313 Cellulitis of chest wall: Principal | ICD-10-CM

## 2015-07-15 DIAGNOSIS — I10 Essential (primary) hypertension: Secondary | ICD-10-CM

## 2015-07-15 DIAGNOSIS — J869 Pyothorax without fistula: Secondary | ICD-10-CM

## 2015-07-15 DIAGNOSIS — G8929 Other chronic pain: Secondary | ICD-10-CM

## 2015-07-15 LAB — CBC
HEMATOCRIT: 36.7 % (ref 36.0–46.0)
HEMOGLOBIN: 12.6 g/dL (ref 12.0–15.0)
MCH: 31.7 pg (ref 26.0–34.0)
MCHC: 34.3 g/dL (ref 30.0–36.0)
MCV: 92.2 fL (ref 78.0–100.0)
Platelets: 333 10*3/uL (ref 150–400)
RBC: 3.98 MIL/uL (ref 3.87–5.11)
RDW: 12.9 % (ref 11.5–15.5)
WBC: 6.8 10*3/uL (ref 4.0–10.5)

## 2015-07-15 MED ORDER — VANCOMYCIN HCL IN DEXTROSE 1-5 GM/200ML-% IV SOLN: 1000.0000 mg | INTRAVENOUS | Status: DC

## 2015-07-15 MED ORDER — SULFAMETHOXAZOLE-TRIMETHOPRIM 800-160 MG PO TABS: 1.0000 | ORAL_TABLET | Freq: Two times a day (BID) | ORAL | Status: DC

## 2015-07-15 MED ORDER — MUPIROCIN 2 % EX OINT
1.0000 | TOPICAL_OINTMENT | Freq: Two times a day (BID) | CUTANEOUS | Status: DC
Start: 2015-07-15 — End: 2015-07-15

## 2015-07-15 MED ORDER — LEVOFLOXACIN IN D5W 750 MG/150ML IV SOLN: 750.0000 mg | INTRAVENOUS | Status: DC

## 2015-07-15 MED ORDER — CHLORHEXIDINE GLUCONATE CLOTH 2 % EX PADS: 6.0000 | MEDICATED_PAD | Freq: Every day | CUTANEOUS | Status: DC

## 2015-07-15 NOTE — Consult Note (Signed)
Reason for Consult: Sebaceous cyst, chest wall Referring Physician: Hospitalist  ACACIA LATORRE is an 82 y.o. female.  HPI: Patient is an 82 year old white female medical problems who underwent incision and drainage of sebaceous cyst on her chest wall over her sternum 2 days ago at an urgent care center in [place]. On follow-up, she was noted to have worsening erythema around the wound extending to both breasts. She was seen by the urgent care center and told to come to [facility] for further evaluation treatment. She was started on vancomycin. Today, patient and family state that she is doing much better. Family describes seem him being expressed from the wound.  Past Medical History  Diagnosis Date  . CAD (coronary artery disease)     DES LAD 6/03, LVEF 67%  . Hyperlipidemia   . Hypertension   . GERD (gastroesophageal reflux disease)   . Hiatal hernia   . Anxiety   . Depression   . SPINAL STENOSIS 12/20/2009    Qualifier: Diagnosis of  By: Aline Brochure MD, Dorothyann Peng    . DEGENERATIVE DISC DISEASE, LUMBOSACRAL SPINE 12/20/2009    Qualifier: Diagnosis of  By: Aline Brochure MD, Dorothyann Peng    . FRACTURE, HUMERUS, PROXIMAL 03/26/2010    Qualifier: Diagnosis of  By: Aline Brochure MD, Dorothyann Peng    . Dementia   . Chronic pain     Past Surgical History  Procedure Laterality Date  . Cataracts    . Nasal sinus surgery    . Tonsilectomy, adenoidectomy, bilateral myringotomy and tubes    . Total abdominal hysterectomy    . Hemorrhoid surgery    . Back surgery    . Cholecystectomy      Family History  Problem Relation Age of Onset  . Coronary artery disease Father   . Cancer Father   . Diabetes Father   . Diabetes Mother   . Diabetes Sister     Social History:  reports that she has never smoked. She has never used smokeless tobacco. She reports that she does not drink alcohol or use illicit drugs.  Allergies:  Allergies  Allergen Reactions  . Pseudoephedrine Other (See Comments)    Pt states she lost  her memory for a day when she took Sudafed.     Medications: I have reviewed the patient's current medications.  Results for orders placed or performed during the hospital encounter of 07/14/15 (from the past 48 hour(s))  Basic metabolic panel     Status: Abnormal   Collection Time: 07/14/15  6:20 PM  Result Value Ref Range   Sodium 139 135 - 145 mmol/L   Potassium 3.5 3.5 - 5.1 mmol/L   Chloride 103 101 - 111 mmol/L   CO2 28 22 - 32 mmol/L   Glucose, Bld 112 (H) 65 - 99 mg/dL   BUN 13 6 - 20 mg/dL   Creatinine, Ser 0.73 0.44 - 1.00 mg/dL   Calcium 9.7 8.9 - 10.3 mg/dL   GFR calc non Af Amer >60 >60 mL/min   GFR calc Af Amer >60 >60 mL/min    Comment: (NOTE) The eGFR has been calculated using the CKD EPI equation. This calculation has not been validated in all clinical situations. eGFR's persistently <60 mL/min signify possible Chronic Kidney Disease.    Anion gap 8 5 - 15  CBC with Differential     Status: None   Collection Time: 07/14/15  6:20 PM  Result Value Ref Range   WBC 5.3 4.0 - 10.5 K/uL  RBC 4.07 3.87 - 5.11 MIL/uL   Hemoglobin 12.8 12.0 - 15.0 g/dL   HCT 53.7 08.3 - 32.7 %   MCV 92.9 78.0 - 100.0 fL   MCH 31.4 26.0 - 34.0 pg   MCHC 33.9 30.0 - 36.0 g/dL   RDW 38.0 94.7 - 01.1 %   Platelets 324 150 - 400 K/uL   Neutrophils Relative % 53 %   Neutro Abs 2.8 1.7 - 7.7 K/uL   Lymphocytes Relative 35 %   Lymphs Abs 1.8 0.7 - 4.0 K/uL   Monocytes Relative 10 %   Monocytes Absolute 0.5 0.1 - 1.0 K/uL   Eosinophils Relative 2 %   Eosinophils Absolute 0.1 0.0 - 0.7 K/uL   Basophils Relative 0 %   Basophils Absolute 0.0 0.0 - 0.1 K/uL  I-Stat CG4 Lactic Acid, ED     Status: None   Collection Time: 07/14/15  6:27 PM  Result Value Ref Range   Lactic Acid, Venous 1.22 0.5 - 2.0 mmol/L  Comprehensive metabolic panel     Status: Abnormal   Collection Time: 07/14/15  9:45 PM  Result Value Ref Range   Sodium 139 135 - 145 mmol/L   Potassium 3.8 3.5 - 5.1 mmol/L    Chloride 103 101 - 111 mmol/L   CO2 27 22 - 32 mmol/L   Glucose, Bld 97 65 - 99 mg/dL   BUN 12 6 - 20 mg/dL   Creatinine, Ser 0.43 0.44 - 1.00 mg/dL   Calcium 9.4 8.9 - 81.3 mg/dL   Total Protein 6.2 (L) 6.5 - 8.1 g/dL   Albumin 3.4 (L) 3.5 - 5.0 g/dL   AST 19 15 - 41 U/L   ALT 16 14 - 54 U/L   Alkaline Phosphatase 82 38 - 126 U/L   Total Bilirubin 0.4 0.3 - 1.2 mg/dL   GFR calc non Af Amer >60 >60 mL/min   GFR calc Af Amer >60 >60 mL/min    Comment: (NOTE) The eGFR has been calculated using the CKD EPI equation. This calculation has not been validated in all clinical situations. eGFR's persistently <60 mL/min signify possible Chronic Kidney Disease.    Anion gap 9 5 - 15  CBC     Status: None   Collection Time: 07/15/15  6:13 AM  Result Value Ref Range   WBC 6.8 4.0 - 10.5 K/uL   RBC 3.98 3.87 - 5.11 MIL/uL   Hemoglobin 12.6 12.0 - 15.0 g/dL   HCT 49.7 53.0 - 45.9 %   MCV 92.2 78.0 - 100.0 fL   MCH 31.7 26.0 - 34.0 pg   MCHC 34.3 30.0 - 36.0 g/dL   RDW 66.2 32.5 - 41.7 %   Platelets 333 150 - 400 K/uL    Dg Chest 2 View  07/14/2015  CLINICAL DATA:  Chest wound and drainage noticed on Monday. EXAM: CHEST  2 VIEW COMPARISON:  04/24/2015 FINDINGS: Mild cardiomegaly. No confluent airspace opacities or effusions. Moderate compression fracture in the mid thoracic spine, progressed since prior thoracic spine CT performed 04/24/2015. IMPRESSION: No acute cardiopulmonary disease. Progressive mid thoracic compression fracture. Electronically Signed   By: Charlett Nose M.D.   On: 07/14/2015 20:41    ROS: See chart Blood pressure 148/65, pulse 60, temperature 98 F (36.7 C), temperature source Oral, resp. rate 20, height 5' (1.524 m), weight 62.097 kg (136 lb 14.4 oz), SpO2 96 %. Physical Exam: Pleasant white female in no acute distress.  Skin examination reveals a 1 cm  opening with surrounding erythema present along the lower midline of the sternum. Erythema described previously  extending to the breast has significantly decreased. I was able to express more perceive him. No significant purulent fluid was expressed.  Assessment/Plan: Impression: Infected sebaceous cyst, resolving Plan: Discussed with family discharge planning and they are more than happy to take the patient home. They've been instructed on how to remove twice a day with a Q-tip and peroxide and applied Bactroban. Would do a 10 day course of Bactrim. I will see her in my office in follow-up next week.  Desirea Mizrahi A 07/15/2015, 12:08 PM

## 2015-07-15 NOTE — Discharge Instructions (Signed)
Clean wound with q-tip/peroxide twice a day, then apply Bactroban cream.    Epidermal Cyst An epidermal cyst is sometimes called a sebaceous cyst, epidermal inclusion cyst, or infundibular cyst. These cysts usually contain a substance that looks "pasty" or "cheesy" and may have a bad smell. This substance is a protein called keratin. Epidermal cysts are usually found on the face, neck, or trunk. They may also occur in the vaginal area or other parts of the genitalia of both men and women. Epidermal cysts are usually small, painless, slow-growing bumps or lumps that move freely under the skin. It is important not to try to pop them. This may cause an infection and lead to tenderness and swelling. CAUSES  Epidermal cysts may be caused by a deep penetrating injury to the skin or a plugged hair follicle, often associated with acne. SYMPTOMS  Epidermal cysts can become inflamed and cause:  Redness.  Tenderness.  Increased temperature of the skin over the bumps or lumps.  Grayish-white, bad smelling material that drains from the bump or lump. DIAGNOSIS  Epidermal cysts are easily diagnosed by your caregiver during an exam. Rarely, a tissue sample (biopsy) may be taken to rule out other conditions that may resemble epidermal cysts. TREATMENT   Epidermal cysts often get better and disappear on their own. They are rarely ever cancerous.  If a cyst becomes infected, it may become inflamed and tender. This may require opening and draining the cyst. Treatment with antibiotics may be necessary. When the infection is gone, the cyst may be removed with minor surgery.  Small, inflamed cysts can often be treated with antibiotics or by injecting steroid medicines.  Sometimes, epidermal cysts become large and bothersome. If this happens, surgical removal in your caregiver's office may be necessary. HOME CARE INSTRUCTIONS  Only take over-the-counter or prescription medicines as directed by your  caregiver.  Take your antibiotics as directed. Finish them even if you start to feel better. SEEK MEDICAL CARE IF:   Your cyst becomes tender, red, or swollen.  Your condition is not improving or is getting worse.  You have any other questions or concerns. MAKE SURE YOU:  Understand these instructions.  Will watch your condition.  Will get help right away if you are not doing well or get worse.   This information is not intended to replace advice given to you by your health care provider. Make sure you discuss any questions you have with your health care provider.   Document Released: 04/06/2004 Document Revised: 07/29/2011 Document Reviewed: 11/12/2010 Elsevier Interactive Patient Education [employer]

## 2015-07-15 NOTE — Progress Notes (Signed)
Patient discharged with instructions, prescription, and care notes.  Verbalized understanding via teach back.  IV was removed and the site was WNL. Patient voiced no further complaints or concerns at the time of discharge.  Appointments scheduled per instructions.  Patient left the floor via w/c with staff and family in stable condition. 

## 2015-07-15 NOTE — Progress Notes (Signed)
CM contacted Dr. Kerry Hough and communicated that the patient's family was shown how to care for the wound.  No HH services to be arranged.

## 2015-07-15 NOTE — Discharge Summary (Signed)
Physician Discharge Summary  Kelsey Luna ZOX:[id number] DOB: Mar 13, 1927 DOA: 07/14/2015  PCP: Kathlee Nations, MD  Admit date: 07/14/2015 Discharge date: 07/15/2015  Time spent: 35 minutes  Recommendations for Outpatient Follow-up:  1. Follow up with PCP in 1-2 weeks. 2. Follow up with Dr. Lovell Sheehan, general surgery, on 07/20/15.   Discharge Diagnoses:  Principal Problem:   Cellulitis of chest wall Active Problems:   Essential hypertension, benign   CORONARY ATHEROSCLEROSIS NATIVE CORONARY ARTERY   Abscess of chest [facility])   Depression with anxiety   Mild dementia   Chronic pain   Discharge Condition: Improved  Diet recommendation: Heart Healthy  [facility] Weights   07/14/15 1445 07/14/15 2136  Weight: 59.421 kg (131 lb) 62.097 kg (136 lb 14.4 oz)    History of present illness:  40 yof with a hx of CAD, HLD, HTN, and GERD presented with a cyst and rash to her chest. Patient was seen by her PCP for similar on 2/21 and started on oral abx. On 2/23 she had an I&D performed however her redness has worsened. She was admitted for further management.   Hospital Course:  Patient presented with an infected sebaceous cyst on her chest wall. She was started on IV abx with significant improvement in the surrounding redness. She has remained afebrile with a normal WBC count. General surgery was consulted and agreed that given the presentation and improvement,  the patient can be discharged on oral abx. She will be placed on a 10 day course of Bactrim with plan to follow up with Dr. Lovell Sheehan on 07/20/15. She has been educated on how to keep the area clean.   1. Essential HTN, stable. Continue home medications. 2. GERD, continue PPI.  3. Chronic pain, continue current management 4. Depression with anxiety, continue home medications.   Procedures:  none   Consultations:  General surgery  Discharge Exam: Filed Vitals:   07/14/15 2136 07/15/15 0406  BP: 134/82 148/65  Pulse: 72 60  Temp: 97.9  F (36.6 C) 98 F (36.7 C)  Resp: 20 20     General: NAD, looks comfortable  Cardiovascular: RRR, S1, S2   Respiratory: clear bilaterally, No wheezing, rales or rhonchi  Abdomen: soft, non tender, no distention , bowel sounds normal  Musculoskeletal: No edema b/l   Discharge Instructions   Discharge Instructions    Diet - low sodium heart healthy    Complete by:  As directed      Increase activity slowly    Complete by:  As directed           Current Discharge Medication List    START taking these medications   Details  sulfamethoxazole-trimethoprim (BACTRIM DS,SEPTRA DS) 800-160 MG tablet Take 1 tablet by mouth 2 (two) times daily. Qty: 20 tablet, Refills: 0      CONTINUE these medications which have NOT CHANGED   Details  ALPRAZolam (XANAX) 0.5 MG tablet Take 0.5-1 mg by mouth 2 (two) times daily. One in the morning and two tabs at bedtime    Ascorbic Acid (VITAMIN C) 1000 MG tablet Take 1,000 mg by mouth every morning.    buPROPion (WELLBUTRIN XL) 300 MG 24 hr tablet Take 300 mg by mouth daily.    busPIRone (BUSPAR) 15 MG tablet Take 15 mg by mouth 2 (two) times daily.    Cranberry-Cholecalciferol (SUPER CRANBERRY/VITAMIN D3) 4200-500 MG-UNIT CAPS Take 1 capsule by mouth every morning.    fentaNYL (DURAGESIC - DOSED MCG/HR) 50 MCG/HR Place 50 mcg onto  the skin every 3 (three) days.     hydrochlorothiazide (HYDRODIURIL) 25 MG tablet Take 25 mg by mouth daily.    lisinopril (PRINIVIL,ZESTRIL) 20 MG tablet Take 20 mg by mouth every morning.    magnesium oxide (MAG-OX) 400 MG tablet Take 400 mg by mouth every morning.    Multiple Vitamin (MULTIVITAMIN WITH MINERALS) TABS tablet Take 1 tablet by mouth every morning.    omeprazole (PRILOSEC) 20 MG capsule Take 20 mg by mouth daily.      oxyCODONE (OXY IR/ROXICODONE) 5 MG immediate release tablet Take 1 tablet (5 mg total) by mouth every 6 (six) hours as needed for severe pain. Qty: 20 tablet, Refills: 0     polyethylene glycol (MIRALAX) powder Take 17 g by mouth daily as needed for mild constipation or moderate constipation.     Potassium 99 MG TABS Take 1 tablet by mouth every morning.     acetaminophen (TYLENOL) 325 MG tablet Take 1 tablet (325 mg total) by mouth every 6 (six) hours as needed for mild pain.    methocarbamol (ROBAXIN) 500 MG tablet Take 1 tablet (500 mg total) by mouth every 6 (six) hours as needed for muscle spasms. Qty: 20 tablet, Refills: 0      STOP taking these medications     cefTRIAXone 1 g in dextrose 5 % 50 mL      doxycycline (VIBRAMYCIN) 100 MG capsule        Allergies  Allergen Reactions  . Pseudoephedrine Other (See Comments)    Pt states she lost her memory for a day when she took Sudafed.    Follow-up Information    Follow up with Dalia Heading, MD. Schedule an appointment as soon as possible for a visit on 07/20/2015.   Specialty:  General Surgery   Contact information:   [address] [place] [address] [phone]        The results of significant diagnostics from this hospitalization (including imaging, microbiology, ancillary and laboratory) are listed below for reference.    Significant Diagnostic Studies: Dg Chest 2 View  07/14/2015  CLINICAL DATA:  Chest wound and drainage noticed on Monday. EXAM: CHEST  2 VIEW COMPARISON:  04/24/2015 FINDINGS: Mild cardiomegaly. No confluent airspace opacities or effusions. Moderate compression fracture in the mid thoracic spine, progressed since prior thoracic spine CT performed 04/24/2015. IMPRESSION: No acute cardiopulmonary disease. Progressive mid thoracic compression fracture. Electronically Signed   By: Charlett Nose M.D.   On: 07/14/2015 20:41     Labs: Basic Metabolic Panel:  Recent Labs Lab 07/14/15 1820 07/14/15 2145  NA 139 139  K 3.5 3.8  CL 103 103  CO2 28 27  GLUCOSE 112* 97  BUN 13 12  CREATININE 0.73 0.62  CALCIUM 9.7 9.4   Liver Function Tests:  Recent  Labs Lab 07/14/15 2145  AST 19  ALT 16  ALKPHOS 82  BILITOT 0.4  PROT 6.2*  ALBUMIN 3.4*    CBC:  Recent Labs Lab 07/14/15 1820 07/15/15 0613  WBC 5.3 6.8  NEUTROABS 2.8  --   HGB 12.8 12.6  HCT 37.8 36.7  MCV 92.9 92.2  PLT 324 333     Signed: Tiffinie Caillier. MD [facility] 07/15/2015, 12:50 PM    By signing my name below, I, Burnett Harry, attest that this documentation has been prepared under the direction and in the presence of [facility]. MD Electronically Signed: Burnett Harry, Scribe. 07/15/2015  I, Dr. Erick Blinks, personally performed the services  described in this documentaiton. All medical record entries made by the scribe were at my direction and in my presence. I have reviewed the chart and agree that the record reflects my personal performance and is accurate and complete  Erick Blinks, MD, 07/15/2015 12:50 PM

## 2015-11-27 ENCOUNTER — Emergency Department (HOSPITAL_COMMUNITY): Payer: Medicare Other

## 2015-11-27 ENCOUNTER — Encounter (HOSPITAL_COMMUNITY): Payer: Self-pay

## 2015-11-27 ENCOUNTER — Emergency Department (HOSPITAL_COMMUNITY)
Admission: EM | Admit: 2015-11-27 | Discharge: 2015-11-27 | Disposition: A | Discharge status: Home / Self Care | Payer: Medicare Other | Attending: Emergency Medicine | Admitting: Emergency Medicine

## 2015-11-27 DIAGNOSIS — I2510 Atherosclerotic heart disease of native coronary artery without angina pectoris: Secondary | ICD-10-CM | POA: Diagnosis not present

## 2015-11-27 DIAGNOSIS — F329 Major depressive disorder, single episode, unspecified: Secondary | ICD-10-CM | POA: Diagnosis not present

## 2015-11-27 DIAGNOSIS — Z79899 Other long term (current) drug therapy: Secondary | ICD-10-CM | POA: Diagnosis not present

## 2015-11-27 DIAGNOSIS — N390 Urinary tract infection, site not specified: Secondary | ICD-10-CM | POA: Diagnosis not present

## 2015-11-27 DIAGNOSIS — E785 Hyperlipidemia, unspecified: Secondary | ICD-10-CM | POA: Insufficient documentation

## 2015-11-27 DIAGNOSIS — Z7982 Long term (current) use of aspirin: Secondary | ICD-10-CM | POA: Insufficient documentation

## 2015-11-27 DIAGNOSIS — Z79891 Long term (current) use of opiate analgesic: Secondary | ICD-10-CM | POA: Diagnosis not present

## 2015-11-27 DIAGNOSIS — R5383 Other fatigue: Secondary | ICD-10-CM

## 2015-11-27 DIAGNOSIS — F0390 Unspecified dementia without behavioral disturbance: Secondary | ICD-10-CM | POA: Insufficient documentation

## 2015-11-27 DIAGNOSIS — I10 Essential (primary) hypertension: Secondary | ICD-10-CM | POA: Insufficient documentation

## 2015-11-27 LAB — CBC WITH DIFFERENTIAL/PLATELET
BASOS ABS: 0 10*3/uL (ref 0.0–0.1)
BASOS PCT: 0 %
EOS ABS: 0.2 10*3/uL (ref 0.0–0.7)
Eosinophils Relative: 2 %
HEMATOCRIT: 37.1 % (ref 36.0–46.0)
Hemoglobin: 12.1 g/dL (ref 12.0–15.0)
LYMPHS PCT: 22 %
Lymphs Abs: 1.6 10*3/uL (ref 0.7–4.0)
MCH: 29.7 pg (ref 26.0–34.0)
MCHC: 32.6 g/dL (ref 30.0–36.0)
MCV: 91.2 fL (ref 78.0–100.0)
MONO ABS: 0.5 10*3/uL (ref 0.1–1.0)
MONOS PCT: 8 %
NEUTROS PCT: 68 %
Neutro Abs: 4.7 10*3/uL (ref 1.7–7.7)
Platelets: 259 10*3/uL (ref 150–400)
RBC: 4.07 MIL/uL (ref 3.87–5.11)
RDW: 12.6 % (ref 11.5–15.5)
WBC: 7 10*3/uL (ref 4.0–10.5)

## 2015-11-27 LAB — BASIC METABOLIC PANEL
ANION GAP: 3 — AB (ref 5–15)
BUN: 15 mg/dL (ref 6–20)
CALCIUM: 9.1 mg/dL (ref 8.9–10.3)
CO2: 29 mmol/L (ref 22–32)
CREATININE: 0.78 mg/dL (ref 0.44–1.00)
Chloride: 106 mmol/L (ref 101–111)
Glucose, Bld: 87 mg/dL (ref 65–99)
Potassium: 3.9 mmol/L (ref 3.5–5.1)
SODIUM: 138 mmol/L (ref 135–145)

## 2015-11-27 LAB — URINALYSIS, ROUTINE W REFLEX MICROSCOPIC
BILIRUBIN URINE: NEGATIVE
Glucose, UA: NEGATIVE mg/dL
Hgb urine dipstick: NEGATIVE
KETONES UR: NEGATIVE mg/dL
NITRITE: NEGATIVE
PH: 7 (ref 5.0–8.0)
Specific Gravity, Urine: 1.015 (ref 1.005–1.030)

## 2015-11-27 LAB — URINE MICROSCOPIC-ADD ON: RBC / HPF: NONE SEEN RBC/hpf (ref 0–5)

## 2015-11-27 LAB — TROPONIN I

## 2015-11-27 MED ORDER — CEPHALEXIN 500 MG PO CAPS: 500.0000 mg | ORAL_CAPSULE | Freq: Two times a day (BID) | ORAL | Status: AC

## 2015-11-27 MED ORDER — CEPHALEXIN 500 MG PO CAPS
500.0000 mg | ORAL_CAPSULE | Freq: Once | ORAL | Status: AC
  Administered 2015-11-27: 500 mg via ORAL
  Filled 2015-11-27: qty 1

## 2015-11-27 NOTE — ED Notes (Signed)
Family states pt slept all day, weak and increased edema

## 2015-11-27 NOTE — ED Notes (Signed)
Patient toileted on bedside commode. Two person assist. Tolerated well.

## 2015-11-27 NOTE — ED Provider Notes (Signed)
CSN: [id number]     Arrival date & time 11/27/15  1512 History   First MD Initiated Contact with Patient 11/27/15 1557     Chief Complaint  Patient presents with  . Fatigue   LEVEL 5 CAVEAT DUE TO DEMENTIA   Patient is a 82 y.o. female presenting with weakness. The history is provided by the patient, a relative and a caregiver.  Weakness This is a new problem. Episode onset: several days. The problem occurs constantly. The problem has been gradually worsening. Pertinent negatives include no chest pain and no shortness of breath. Exacerbated by: activity. The symptoms are relieved by rest.   Per family, pt has had increased fatigue for past several days She had recent increased in LE edema, PCP increased lasix and since that time she has had significant fatigue.  She will wake up to eat and go to bathroom and then go back to sleep No fever/vomiting No falls No pain complaints She does have some cough at times  Past Medical History  Diagnosis Date  . CAD (coronary artery disease)     DES LAD 6/03, LVEF 67%  . Hyperlipidemia   . Hypertension   . GERD (gastroesophageal reflux disease)   . Hiatal hernia   . Anxiety   . Depression   . SPINAL STENOSIS 12/20/2009    Qualifier: Diagnosis of  By: Romeo AppleHarrison MD, Duffy RhodyStanley    . DEGENERATIVE DISC DISEASE, LUMBOSACRAL SPINE 12/20/2009    Qualifier: Diagnosis of  By: Romeo AppleHarrison MD, Duffy RhodyStanley    . FRACTURE, HUMERUS, PROXIMAL 03/26/2010    Qualifier: Diagnosis of  By: Romeo AppleHarrison MD, Duffy RhodyStanley    . Dementia   . Chronic pain    Past Surgical History  Procedure Laterality Date  . Cataracts    . Nasal sinus surgery    . Tonsilectomy, adenoidectomy, bilateral myringotomy and tubes    . Total abdominal hysterectomy    . Hemorrhoid surgery    . Back surgery    . Cholecystectomy     Family History  Problem Relation Age of Onset  . Coronary artery disease Father   . Cancer Father   . Diabetes Father   . Diabetes Mother   . Diabetes Sister    Social  History  Substance Use Topics  . Smoking status: Never Smoker   . Smokeless tobacco: Never Used  . Alcohol Use: No   OB History    No data available     Review of Systems  Unable to perform ROS: Dementia  Constitutional: Negative for fever.  Respiratory: Negative for shortness of breath.   Cardiovascular: Negative for chest pain.  Neurological: Positive for weakness.      Allergies  Pseudoephedrine  Home Medications   Prior to Admission medications   Medication Sig Start Date End Date Taking? Authorizing Provider  ALPRAZolam Prudy Feeler(XANAX) 0.5 MG tablet Take 1 mg by mouth at bedtime. One in the morning and two tabs at bedtime 06/15/15  Yes Historical Provider, MD  aspirin EC 325 MG tablet Take 325 mg by mouth daily.   Yes Historical Provider, MD  buPROPion (WELLBUTRIN XL) 300 MG 24 hr tablet Take 300 mg by mouth every morning.    Yes Historical Provider, MD  busPIRone (BUSPAR) 15 MG tablet Take 15 mg by mouth 2 (two) times daily.   Yes Historical Provider, MD  fentaNYL (DURAGESIC - DOSED MCG/HR) 75 MCG/HR Place 75 mcg onto the skin every 3 (three) days.   Yes Historical Provider, MD  furosemide (LASIX)  20 MG tablet Take 20 mg by mouth every morning.   Yes Historical Provider, MD  hydrochlorothiazide (HYDRODIURIL) 25 MG tablet Take 25 mg by mouth daily.   Yes Historical Provider, MD  HYDROcodone-acetaminophen (NORCO/VICODIN) 5-325 MG tablet Take 1 tablet by mouth daily as needed for moderate pain.  09/19/15  Yes Historical Provider, MD  lidocaine (LIDODERM) 5 % Place 1 patch onto the skin daily as needed. For pain   Yes Historical Provider, MD  lisinopril (PRINIVIL,ZESTRIL) 20 MG tablet Take 20 mg by mouth every morning.   Yes Historical Provider, MD  Multiple Vitamin (MULTIVITAMIN WITH MINERALS) TABS tablet Take 1 tablet by mouth daily.   Yes Historical Provider, MD  naloxone HCl 4 MG/0.1ML LIQD Place into the nose once as needed (if overdosed).  09/25/15  Yes Historical Provider, MD   omeprazole (PRILOSEC) 20 MG capsule Take 20 mg by mouth daily.     Yes Historical Provider, MD  Potassium 99 MG TABS Take 1 tablet by mouth every morning.    Yes Historical Provider, MD  simvastatin (ZOCOR) 20 MG tablet Take 20 mg by mouth at bedtime.   Yes Historical Provider, MD   BP 171/84 mmHg  Pulse 72  Temp(Src) 97.6 F (36.4 C) (Oral)  Resp 18  Ht 5\' 7"  (1.702 m)  Wt 63.504 kg  BMI 21.92 kg/m2  SpO2 96% Physical Exam CONSTITUTIONAL: Elderly, frail HEAD: Normocephalic/atraumatic EYES: EOMI ENMT: Mucous membranes moist NECK: supple no meningeal signs CV: S1/S2 noted, no murmurs/rubs/gallops noted LUNGS: Lungs are clear to auscultation bilaterally, no apparent distress ABDOMEN: soft, nontender GU:no cva tenderness NEURO: sleeping on arrival to room but easily arousable, smiling and follows commands. She moves all extremitiesx4.  No facial droop.   EXTREMITIES: pulses normal/equal, +LE edema noted SKIN: warm, color normal PSYCH: unable to assess  ED Course  Procedures  Labs Review Labs Reviewed  BASIC METABOLIC PANEL - Abnormal; Notable for the following:    Anion gap 3 (*)    All other components within normal limits  URINALYSIS, ROUTINE W REFLEX MICROSCOPIC (NOT AT [facility]) - Abnormal; Notable for the following:    APPearance HAZY (*)    Protein, ur TRACE (*)    Leukocytes, UA SMALL (*)    All other components within normal limits  URINE MICROSCOPIC-ADD ON - Abnormal; Notable for the following:    Squamous Epithelial / LPF 6-30 (*)    Bacteria, UA MANY (*)    All other components within normal limits  URINE CULTURE  CBC WITH DIFFERENTIAL/PLATELET  TROPONIN I    Imaging Review Dg Chest Portable 1 View  11/27/2015  CLINICAL DATA:  Cough, chest congestion and fatigue. EXAM: PORTABLE CHEST 1 VIEW COMPARISON:  PA and lateral chest 07/14/2015. FINDINGS: Lung volumes are somewhat low with mild basilar atelectasis. No pneumothorax or pleural effusion. Heart size is  upper normal. Remote surgical neck fracture left humerus is noted. IMPRESSION: No acute finding in a low volume chest. Electronically Signed   By: Drusilla Kanner M.D.   On: 11/27/2015 16:54   I have personally reviewed and evaluated these images and lab results as part of my medical decision-making.   EKG Interpretation   Date/Time:  Monday November 27 2015 17:00:58 EDT Ventricular Rate:  63 PR Interval:    QRS Duration: 92 QT Interval:  437 QTC Calculation: 448 R Axis:   3 Text Interpretation:  Sinus rhythm Borderline T abnormalities, anterior  leads No significant change since last tracing Confirmed by [facility]  MD,  Dorinda Hill ([id number]) on 11/27/2015 5:05:38 PM     tPA in stroke considered but not given due to: Onset over 3-4.5hours  Labs essentially unremarkable CT head ordered 8:23 PM Labs reassuring Pt awake/alert and appears near baseline Family admits this has been a steady decline but seems worse in past several days At this point no obvious medical cause as source of fatigue I did offer home health or even nursing home referral They would prefer to take patient home She has family support at home and already has an aide who will try to arrange home health BP 145/73 mmHg  Pulse 65  Temp(Src) 97.8 F (36.6 C) (Oral)  Resp 15  Ht  (1.702 m)  Wt 63.504 kg  BMI 21.92 kg/m2  SpO2 95%  MDM   Final diagnoses:  Other fatigue    Nursing notes including past medical history and social history reviewed and considered in documentation Labs/vital reviewed myself and considered during evaluation xrays/imaging reviewed by myself and considered during evaluation     Zadie Rhine, MD 11/27/15 2024

## 2015-11-27 NOTE — ED Notes (Signed)
Py transported back home by [facility].

## 2015-11-27 NOTE — ED Notes (Addendum)
2 person assist to get on and off bed side commode, pt is unsteady

## 2015-11-27 NOTE — ED Notes (Signed)
Assisted to bedside commode

## 2015-11-27 NOTE — Discharge Instructions (Signed)

## 2015-11-27 NOTE — ED Notes (Signed)
2 person assist from bedside commode back to the bed.

## 2015-11-27 NOTE — ED Notes (Signed)
complain of generalized fatigue

## 2015-11-30 LAB — URINE CULTURE

## 2015-12-01 ENCOUNTER — Telehealth: Payer: Self-pay | Admitting: *Deleted

## 2015-12-01 NOTE — Telephone Encounter (Signed)
Post ED Visit - Positive Culture Follow-up  Culture report reviewed by antimicrobial stewardship pharmacist:  []  Enzo BiNathan Batchelder, Pharm.D. []  Celedonio MiyamotoJeremy Frens, Pharm.D., BCPS []  Garvin FilaMike Maccia, Pharm.D. [x]  Georgina PillionElizabeth Martin, Pharm.D., BCPS []  MoodusMinh Pham, [place]D., BCPS, AAHIVP []  Estella HuskMichelle Turner, Pharm.D., BCPS, AAHIVP []  Tennis Mustassie Stewart, [address] []  Sherle Poeob Vincent, [address]  Positive urine culture Treated with Cephalexin, organism sensitive to the same and no further patient follow-up is required at this time.  Virl AxeRobertson, Tr[facility] 12/01/2015, 11:30 AM

## 2016-12-21 IMAGING — CT CT T SPINE W/O CM
2 of 3 series · 11 of 33 positions shown, 13 images · non-contrast
Comparison: None.

CLINICAL DATA: Status post fall, with mid back pain. Initial
encounter.

EXAM:
CT THORACIC AND LUMBAR SPINE WITHOUT CONTRAST
TECHNIQUE: Multidetector CT imaging of the thoracic and lumbar spine was
performed without contrast. Multiplanar CT image reconstructions
were also generated.

[Series 3: lumbar t&l spine 2.0 b30s · axial · 0.36mm/px · z∈[+906,+1278]mm · 8 of 220 slices shown, 10 images]
[im 17/220  soft-tissue]
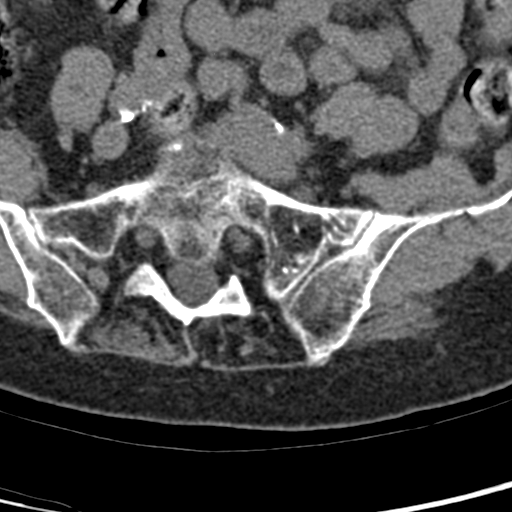
[im 17/220  bone]
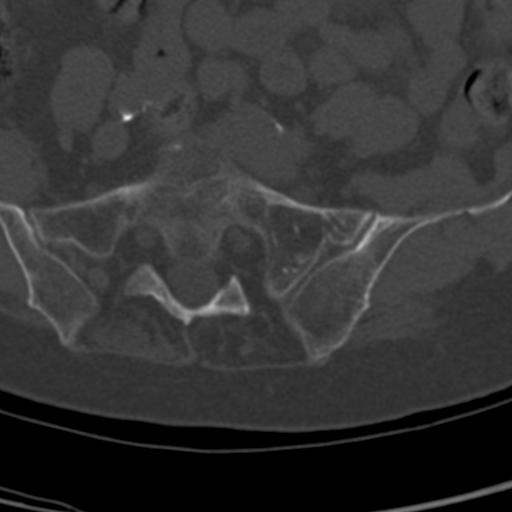
[im 51/220  bone]
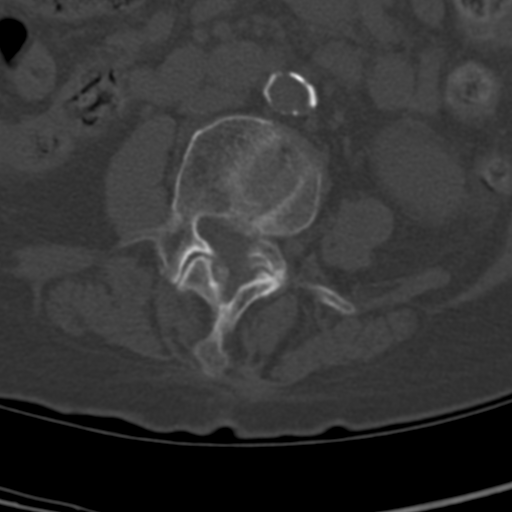
[im 68/220  bone]
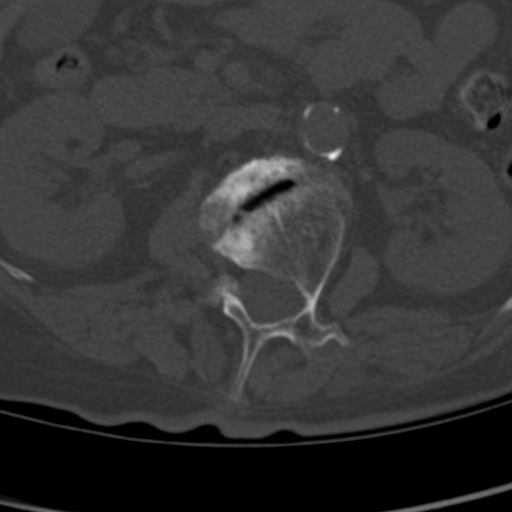
[im 102/220  bone]
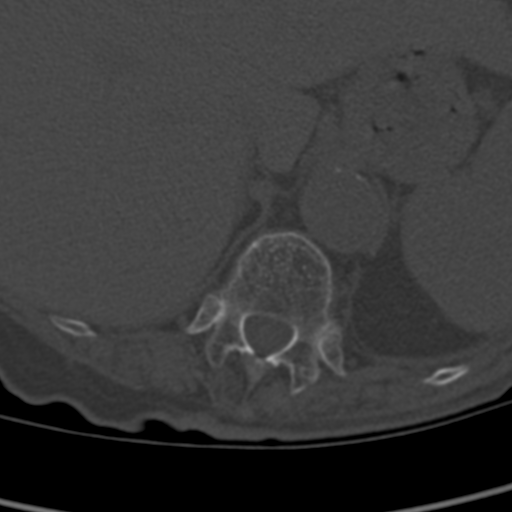
[im 118/220  soft-tissue]
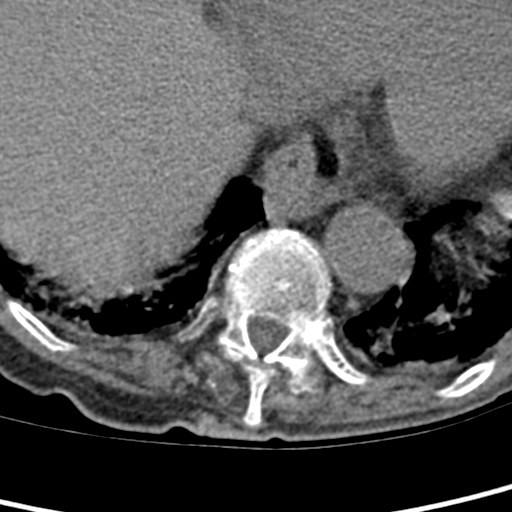
[im 118/220  bone]
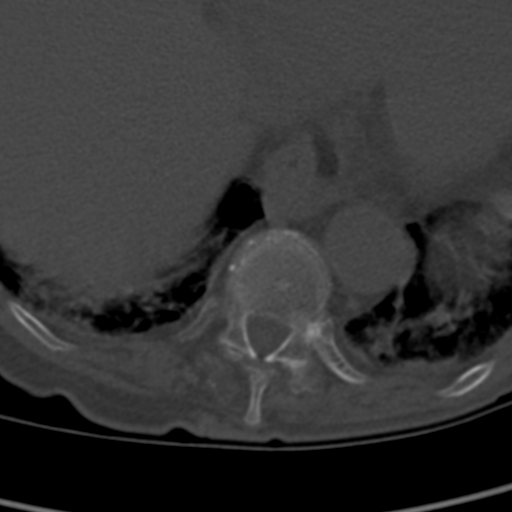
[im 152/220  bone]
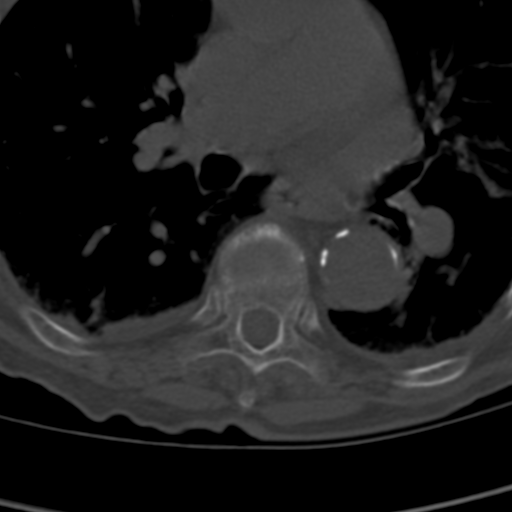
[im 169/220  bone]
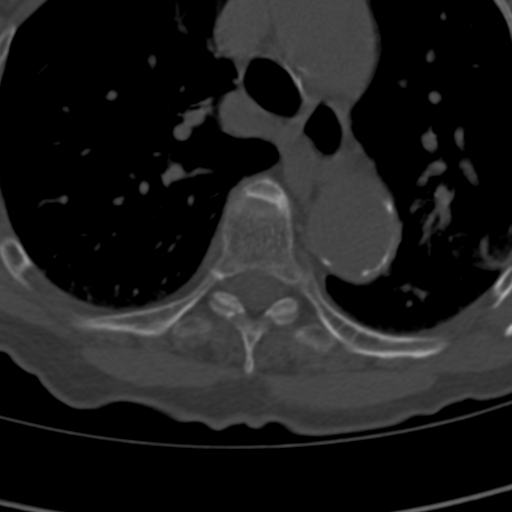
[im 203/220  bone]
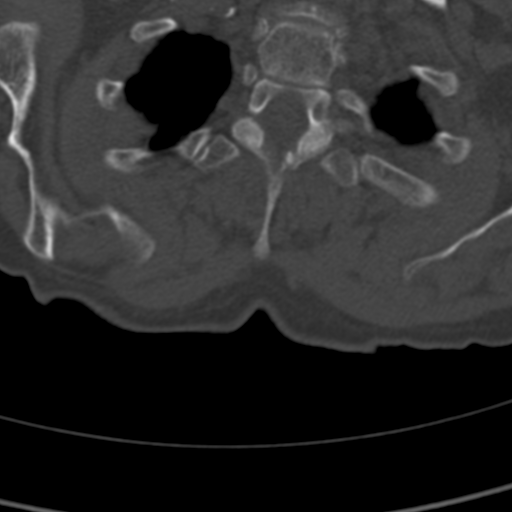

[Series 4: lumbar t&l spine 2.0 spo cor cor · coronal · 0.35mm/px · 3 of 81 slices shown]
[im 17/81  bone]
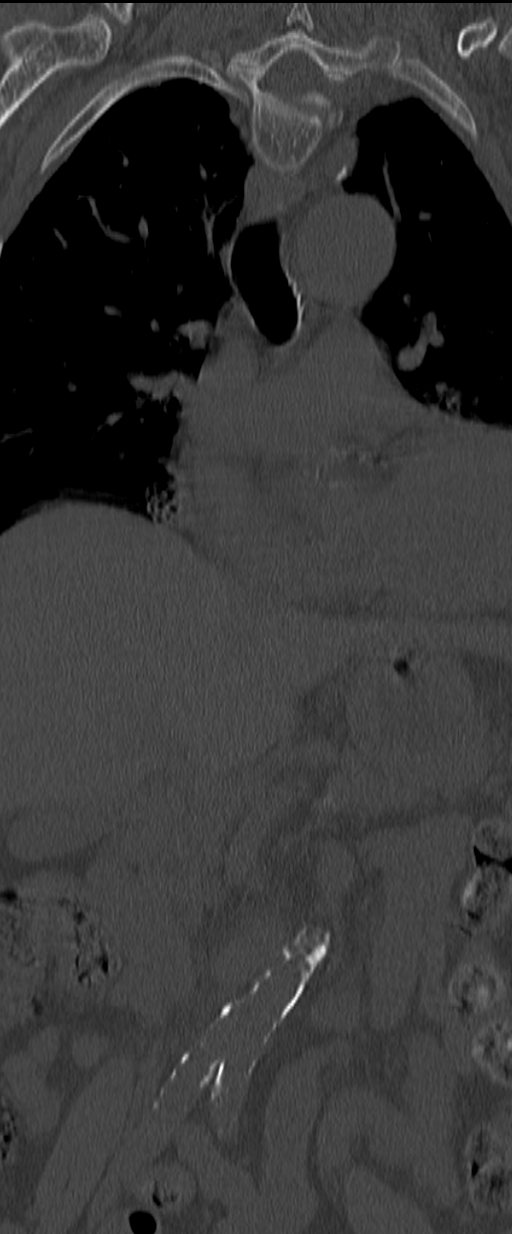
[im 33/81  bone]
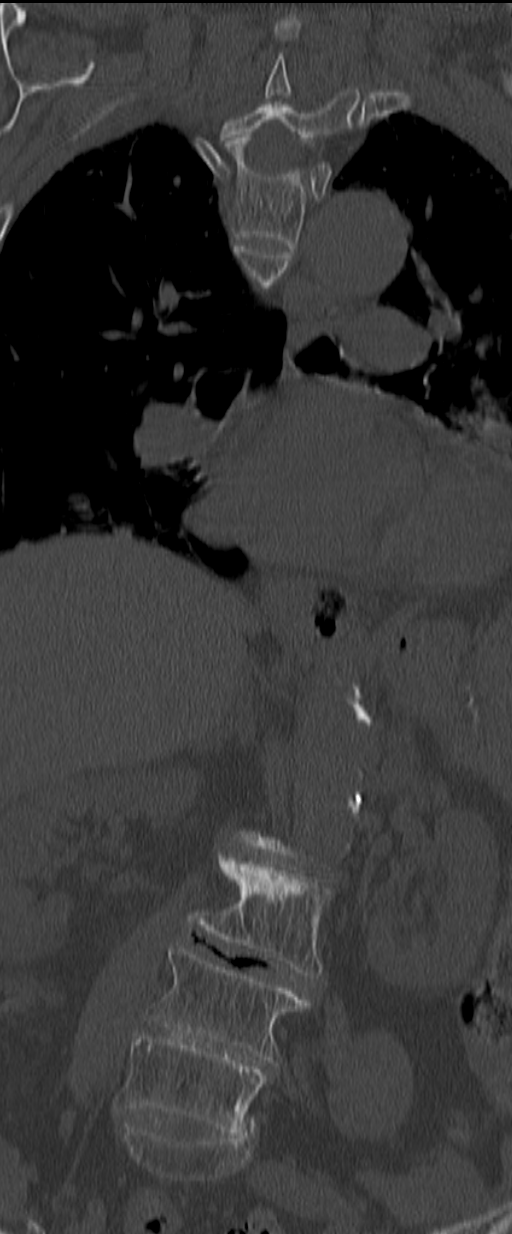
[im 49/81  bone]
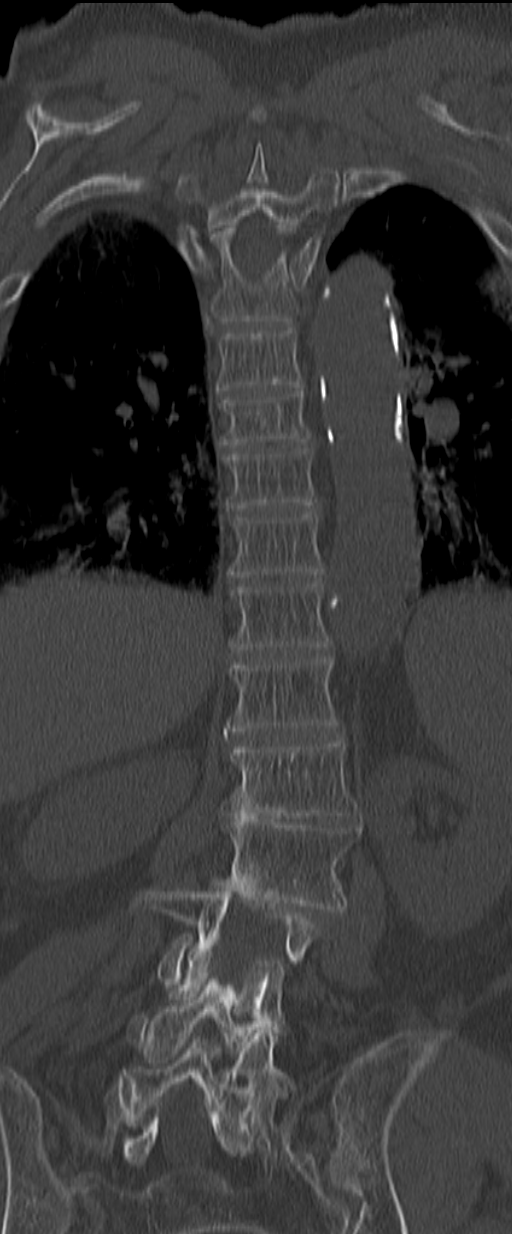

[11 of 33 positions shown; findings below may reference images not displayed]

FINDINGS: CT THORACIC SPINE FINDINGS

There appears to be a mild acute compression fracture involving
vertebral body T8, with approximately 20% loss of height. Slight
cortical irregularity is noted along the anterior inferior aspect of
the vertebral body. There is no evidence of retropulsion or
extension to the posterior elements. No additional fractures are
seen.

Visualized intervertebral disc spaces are grossly unremarkable,
aside from minimal vacuum phenomenon along the lower thoracic spine.
The visualized bony foramina are grossly unremarkable in appearance.

Mild bibasilar atelectasis is noted, with underlying scarring. No
significant pleural effusion or pneumothorax is seen, though the
lungs are incompletely imaged. Diffuse coronary artery
calcifications are seen. The heart is mildly enlarged.

CT LUMBAR SPINE FINDINGS

There is no evidence of fracture or subluxation along the lumbar
spine. Intervertebral disc space narrowing is noted at L1-L2 and
L2-L3, and vacuum phenomenon is noted at L2-L3 and L3-L4. Mild
endplate sclerotic change is noted at L2-L3. There is mild left
convex lumbar scoliosis.

Diffuse calcification is noted along the abdominal aorta and its
branches. A small hiatal hernia is noted. There is mild diffuse
atrophy of the paraspinal musculature.
IMPRESSION: 1. Mild acute compression fracture involving vertebral body T8, with
approximately 20% loss of height and slight cortical irregularity
along the anterior inferior aspect of the vertebral body. No
evidence of retropulsion or extension to the posterior elements.
2. Mild degenerative change along the lower thoracic and lumbar
spine, and mild left convex lumbar scoliosis.
3. Mild bibasilar atelectasis, with underlying scarring.
4. Mild cardiomegaly and diffuse coronary artery calcifications.
5. Diffuse calcification along the abdominal aorta and its branches.
6. Small hiatal hernia noted.
7. Mild diffuse atrophy of the paraspinal musculature.

## 2016-12-21 IMAGING — DX DG CHEST 1V
1 series · 1 of 1 positions shown · non-contrast
Comparison: None.

CLINICAL DATA: Status post fall

EXAM:
CHEST 1 VIEW

[chest ap]
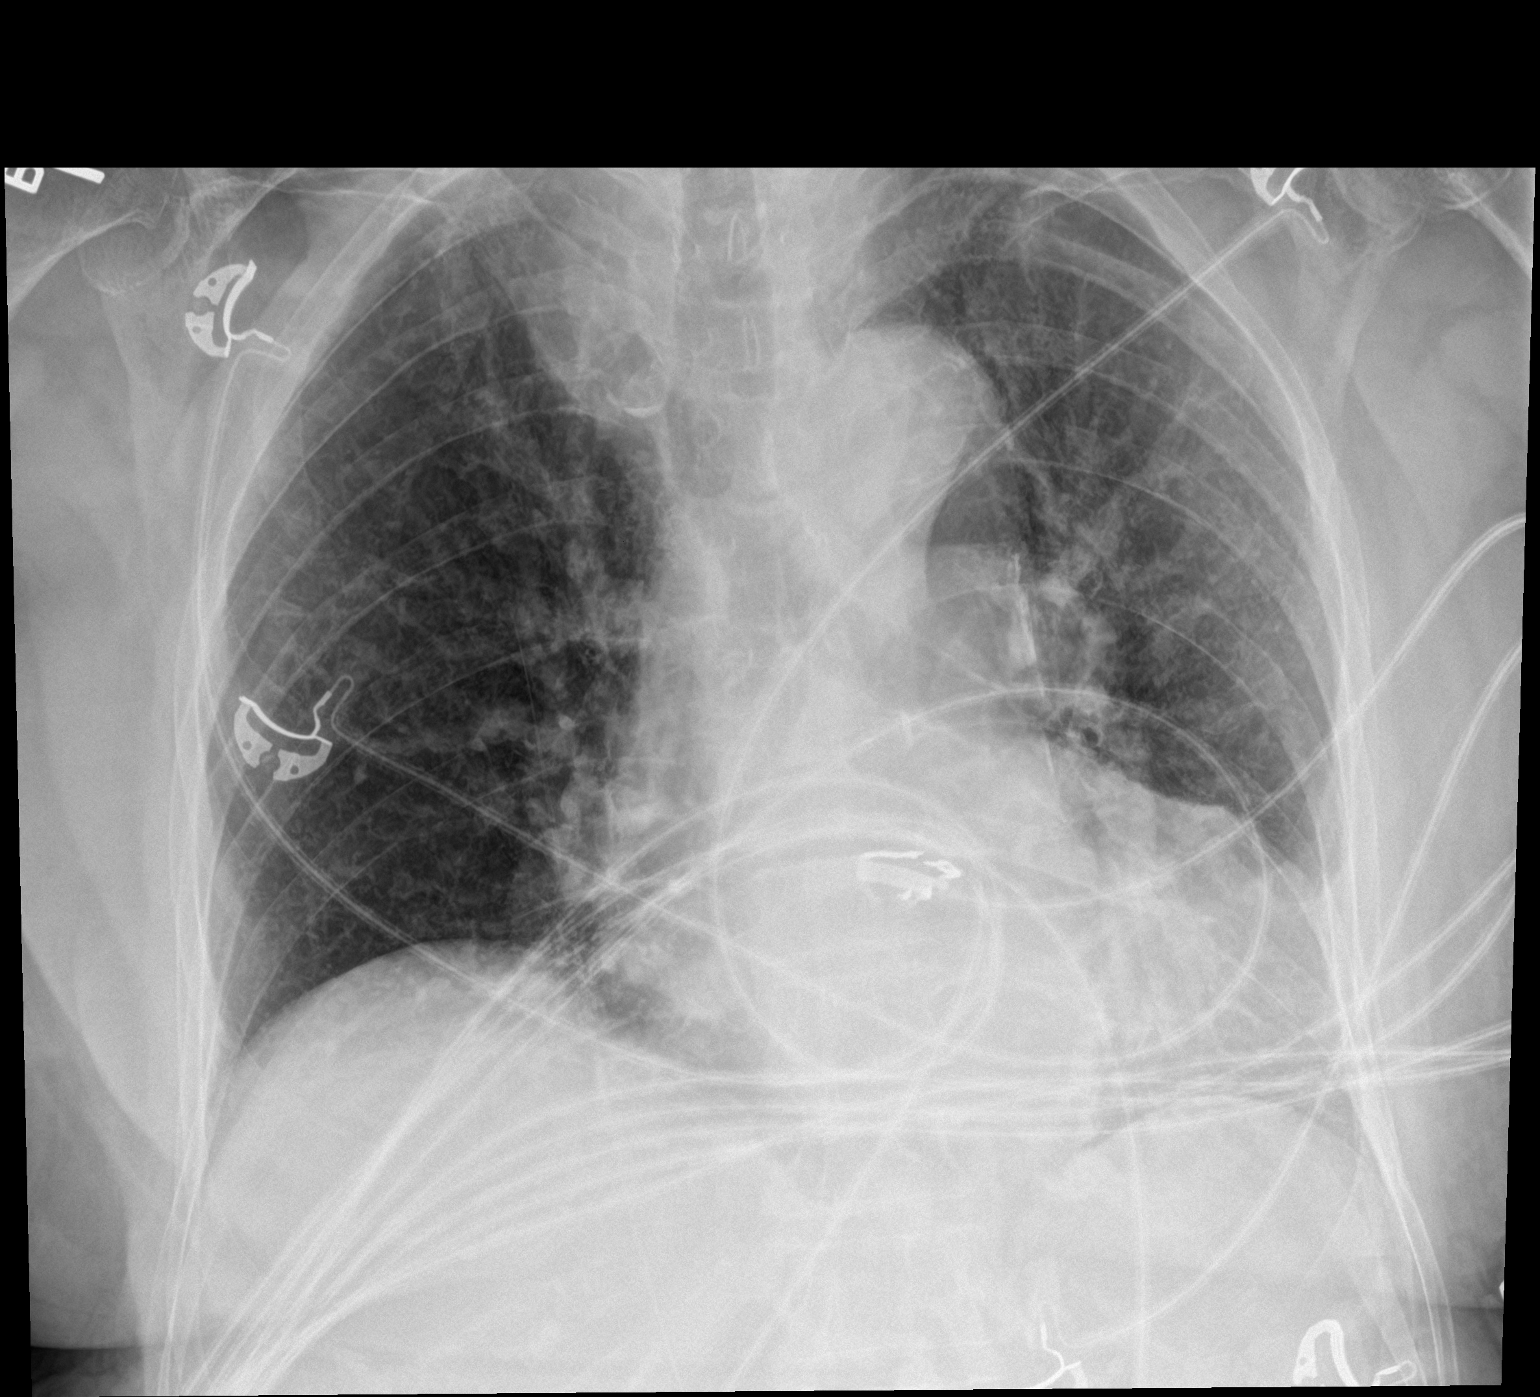

[1 of 1 positions shown; findings below may reference images not displayed]

FINDINGS: Lungs are clear.  No pleural effusion or pneumothorax.

Cardiomegaly.
IMPRESSION: No evidence of acute cardiopulmonary disease.

## 2020-07-18 DEATH — deceased
